# Patient Record
Sex: Female | Born: 1985 | Race: Black or African American | Hispanic: No | Marital: Single | Smoking: Former smoker
Health system: Southern US, Community
[De-identification: ages and names within clinical notes are randomized; demographics above are authoritative.]

## PROBLEM LIST (undated history)

## (undated) HISTORY — PX: ELBOW SURGERY: SHX618

---

## 2008-12-29 ENCOUNTER — Emergency Department (HOSPITAL_BASED_OUTPATIENT_CLINIC_OR_DEPARTMENT_OTHER): Admission: EM | Admit: 2008-12-29 | Discharge: 2008-12-29 | Payer: Self-pay | Admitting: Emergency Medicine

## 2008-12-29 ENCOUNTER — Ambulatory Visit: Payer: Self-pay | Admitting: Diagnostic Radiology

## 2010-05-05 ENCOUNTER — Emergency Department (INDEPENDENT_AMBULATORY_CARE_PROVIDER_SITE_OTHER): Payer: Self-pay

## 2010-05-05 ENCOUNTER — Emergency Department (HOSPITAL_BASED_OUTPATIENT_CLINIC_OR_DEPARTMENT_OTHER)
Admission: EM | Admit: 2010-05-05 | Discharge: 2010-05-05 | Disposition: A | Discharge status: Home / Self Care | Payer: Self-pay | Attending: Emergency Medicine | Admitting: Emergency Medicine

## 2010-05-05 DIAGNOSIS — S61409A Unspecified open wound of unspecified hand, initial encounter: Secondary | ICD-10-CM | POA: Insufficient documentation

## 2010-05-05 DIAGNOSIS — S61509A Unspecified open wound of unspecified wrist, initial encounter: Secondary | ICD-10-CM | POA: Insufficient documentation

## 2010-05-05 DIAGNOSIS — W268XXA Contact with other sharp object(s), not elsewhere classified, initial encounter: Secondary | ICD-10-CM | POA: Insufficient documentation

## 2010-05-05 DIAGNOSIS — Y92009 Unspecified place in unspecified non-institutional (private) residence as the place of occurrence of the external cause: Secondary | ICD-10-CM | POA: Insufficient documentation

## 2010-05-05 DIAGNOSIS — F17200 Nicotine dependence, unspecified, uncomplicated: Secondary | ICD-10-CM | POA: Insufficient documentation

## 2010-05-05 DIAGNOSIS — S61209A Unspecified open wound of unspecified finger without damage to nail, initial encounter: Secondary | ICD-10-CM | POA: Insufficient documentation

## 2012-11-01 ENCOUNTER — Encounter (HOSPITAL_BASED_OUTPATIENT_CLINIC_OR_DEPARTMENT_OTHER): Payer: Self-pay | Admitting: Emergency Medicine

## 2012-11-01 ENCOUNTER — Emergency Department (HOSPITAL_BASED_OUTPATIENT_CLINIC_OR_DEPARTMENT_OTHER)
Admission: EM | Admit: 2012-11-01 | Discharge: 2012-11-01 | Discharge status: Against Medical Advice | Payer: Medicaid Other | Attending: Emergency Medicine | Admitting: Emergency Medicine

## 2012-11-01 DIAGNOSIS — M25519 Pain in unspecified shoulder: Secondary | ICD-10-CM | POA: Insufficient documentation

## 2012-11-01 DIAGNOSIS — F17200 Nicotine dependence, unspecified, uncomplicated: Secondary | ICD-10-CM | POA: Insufficient documentation

## 2012-11-01 NOTE — ED Notes (Signed)
Pt reports right posterior shoulder pain radiating to right side of upper back

## 2012-11-01 NOTE — ED Notes (Signed)
Pt observed walking out of ER.  She states she has to take someone home and will return.  Informed pt she would need to check back in for evaluation.

## 2013-08-30 ENCOUNTER — Emergency Department (HOSPITAL_BASED_OUTPATIENT_CLINIC_OR_DEPARTMENT_OTHER)
Admission: EM | Admit: 2013-08-30 | Discharge: 2013-08-30 | Disposition: A | Discharge status: Home / Self Care | Payer: Medicaid Other | Attending: Emergency Medicine | Admitting: Emergency Medicine

## 2013-08-30 ENCOUNTER — Emergency Department (HOSPITAL_BASED_OUTPATIENT_CLINIC_OR_DEPARTMENT_OTHER): Payer: Medicaid Other

## 2013-08-30 ENCOUNTER — Encounter (HOSPITAL_BASED_OUTPATIENT_CLINIC_OR_DEPARTMENT_OTHER): Payer: Self-pay | Admitting: Emergency Medicine

## 2013-08-30 DIAGNOSIS — M25569 Pain in unspecified knee: Secondary | ICD-10-CM | POA: Insufficient documentation

## 2013-08-30 DIAGNOSIS — Z791 Long term (current) use of non-steroidal anti-inflammatories (NSAID): Secondary | ICD-10-CM | POA: Insufficient documentation

## 2013-08-30 DIAGNOSIS — M25561 Pain in right knee: Secondary | ICD-10-CM

## 2013-08-30 DIAGNOSIS — F17200 Nicotine dependence, unspecified, uncomplicated: Secondary | ICD-10-CM | POA: Insufficient documentation

## 2013-08-30 MED ORDER — NAPROXEN 500 MG PO TABS: 500.0000 mg | ORAL_TABLET | Freq: Two times a day (BID) | ORAL | Status: DC

## 2013-08-30 NOTE — ED Provider Notes (Signed)
CSN: [id number]     Arrival date & time 08/30/13  1631 History   First MD Initiated Contact with Patient 08/30/13 1638     Chief Complaint  Patient presents with  . Knee Pain     (Consider location/radiation/quality/duration/timing/severity/associated sxs/prior Treatment) HPI Comments: Patient is a 28 year old female who presents to the emergency department complaining of right knee pain times one week. No known injury or trauma. She does report she plays around with her friends and family including football which she has done prior to onset of knee pain. She believes her knee appears swollen. She has tried taking ibuprofen with minimal relief. Pain occasionally radiates of the front of her right thigh, worse with pressure. Denies fever, chills or skin color changes.  Patient is a 28 y.o. female presenting with knee pain. The history is provided by the patient.  Knee Pain Associated symptoms: no fever     History reviewed. No pertinent past medical history. Past Surgical History  Procedure Laterality Date  . Elbow surgery     No family history on file. History  Substance Use Topics  . Smoking status: Current Every Day Smoker -- 0.50 packs/day    Types: Cigarettes  . Smokeless tobacco: Not on file  . Alcohol Use: No     Comment: occasional   OB History   Grav Para Term Preterm Abortions TAB SAB Ect Mult Living                 Review of Systems  Constitutional: Negative for fever.  HENT: Negative.   Musculoskeletal:       + R knee pain and swelling.  Skin: Negative.   Neurological: Negative.       Allergies  Review of patient's allergies indicates no known allergies.  Home Medications   Prior to Admission medications   Medication Sig Start Date End Date Taking? Authorizing Provider  naproxen (NAPROSYN) 500 MG tablet Take 1 tablet (500 mg total) by mouth 2 (two) times daily. 08/30/13   Trevor Maceobyn M Albert, PA-C   BP 138/77  Pulse 82  Temp(Src) 98.3 F (36.8 C) (Oral)   Resp 18  Ht 5\' 5"  (1.651 m)  Wt 145 lb (65.772 kg)  BMI 24.13 kg/m2  SpO2 100%  LMP 08/28/2013 Physical Exam  Nursing note and vitals reviewed. Constitutional: She is oriented to person, place, and time. She appears well-developed and well-nourished. No distress.  HENT:  Head: Normocephalic and atraumatic.  Mouth/Throat: Oropharynx is clear and moist.  Eyes: Conjunctivae and EOM are normal.  Neck: Normal range of motion. Neck supple.  Cardiovascular: Normal rate, regular rhythm, normal heart sounds and intact distal pulses.   Pulmonary/Chest: Effort normal and breath sounds normal. No respiratory distress.  Musculoskeletal:  TTP medial joint line of right knee with swelling medially. No erythema or warmth. Pain with valgus testing. Able to flex and extend right knee, pain noted. No calf or thigh tenderness/swelling.  Neurological: She is alert and oriented to person, place, and time. No sensory deficit.  Skin: Skin is warm and dry.  Psychiatric: She has a normal mood and affect. Her behavior is normal.    ED Course  Procedures (including critical care time) Labs Review Labs Reviewed - No data to display  Imaging Review Dg Knee Complete 4 Views Right  08/30/2013   CLINICAL DATA:  Right knee pain after injury.  EXAM: RIGHT KNEE - COMPLETE 4+ VIEW  COMPARISON:  None.  FINDINGS: Imaged bones, joints and soft tissues appear  normal.  IMPRESSION: Normal exam.   Electronically Signed   By: Drusilla Kanner M.D.   On: 08/30/2013 17:50     EKG Interpretation None      MDM   Final diagnoses:  Right knee pain   Patient presenting with right knee pain. She is well appearing and in no apparent distress. Afebrile, vital signs stable. Neurovascularly intact. No known injury or trauma, however she may have hurt herself while playing football with her friends and family. Swelling and pain noted to the medial aspect of right knee. X-ray without any acute finding. Cannot exclude ligamentous  injury. Knee sleeve applied, crutches given. Discussed RICE, NSAIDs. F/u with ortho if no improvement. Stable for d/c. Return precautions given. Patient states understanding of treatment care plan and is agreeable.   Trevor Mace, PA-C 08/30/13 1610

## 2013-08-30 NOTE — Discharge Instructions (Signed)
Take naproxen as directed for pain and swelling. Knee Pain The knee is the complex joint between your thigh and your lower leg. It is made up of bones, tendons, ligaments, and cartilage. The bones that make up the knee are:  The femur in the thigh.  The tibia and fibula in the lower leg.  The patella or kneecap riding in the groove on the lower femur. CAUSES  Knee pain is a common complaint with many causes. A few of these causes are:  Injury, such as:  A ruptured ligament or tendon injury.  Torn cartilage.  Medical conditions, such as:  Gout  Arthritis  Infections  Overuse, over training, or overdoing a physical activity. Knee pain can be minor or severe. Knee pain can accompany debilitating injury. Minor knee problems often respond well to self-care measures or get well on their own. More serious injuries may need medical intervention or even surgery. SYMPTOMS The knee is complex. Symptoms of knee problems can vary widely. Some of the problems are:  Pain with movement and weight bearing.  Swelling and tenderness.  Buckling of the knee.  Inability to straighten or extend your knee.  Your knee locks and you cannot straighten it.  Warmth and redness with pain and fever.  Deformity or dislocation of the kneecap. DIAGNOSIS  Determining what is wrong may be very straight forward such as when there is an injury. It can also be challenging because of the complexity of the knee. Tests to make a diagnosis may include:  Your caregiver taking a history and doing a physical exam.  Routine X-rays can be used to rule out other problems. X-rays will not reveal a cartilage tear. Some injuries of the knee can be diagnosed by:  Arthroscopy a surgical technique by which a small video camera is inserted through tiny incisions on the sides of the knee. This procedure is used to examine and repair internal knee joint problems. Tiny instruments can be used during arthroscopy to repair  the torn knee cartilage (meniscus).  Arthrography is a radiology technique. A contrast liquid is directly injected into the knee joint. Internal structures of the knee joint then become visible on X-ray film.  An MRI scan is a non X-ray radiology procedure in which magnetic fields and a computer produce two- or three-dimensional images of the inside of the knee. Cartilage tears are often visible using an MRI scanner. MRI scans have largely replaced arthrography in diagnosing cartilage tears of the knee.  Blood work.  Examination of the fluid that helps to lubricate the knee joint (synovial fluid). This is done by taking a sample out using a needle and a syringe. TREATMENT The treatment of knee problems depends on the cause. Some of these treatments are:  Depending on the injury, proper casting, splinting, surgery, or physical therapy care will be needed.  Give yourself adequate recovery time. Do not overuse your joints. If you begin to get sore during workout routines, back off. Slow down or do fewer repetitions.  For repetitive activities such as cycling or running, maintain your strength and nutrition.  Alternate muscle groups. For example, if you are a weight lifter, work the upper body on one day and the lower body the next.  Either tight or weak muscles do not give the proper support for your knee. Tight or weak muscles do not absorb the stress placed on the knee joint. Keep the muscles surrounding the knee strong.  Take care of mechanical problems.  If you have  flat feet, orthotics or special shoes may help. See your caregiver if you need help.  Arch supports, sometimes with wedges on the inner or outer aspect of the heel, can help. These can shift pressure away from the side of the knee most bothered by osteoarthritis.  A brace called an "unloader" brace also may be used to help ease the pressure on the most arthritic side of the knee.  If your caregiver has prescribed crutches,  braces, wraps or ice, use as directed. The acronym for this is PRICE. This means protection, rest, ice, compression, and elevation.  Nonsteroidal anti-inflammatory drugs (NSAIDs), can help relieve pain. But if taken immediately after an injury, they may actually increase swelling. Take NSAIDs with food in your stomach. Stop them if you develop stomach problems. Do not take these if you have a history of ulcers, stomach pain, or bleeding from the bowel. Do not take without your caregiver's approval if you have problems with fluid retention, heart failure, or kidney problems.  For ongoing knee problems, physical therapy may be helpful.  Glucosamine and chondroitin are over-the-counter dietary supplements. Both may help relieve the pain of osteoarthritis in the knee. These medicines are different from the usual anti-inflammatory drugs. Glucosamine may decrease the rate of cartilage destruction.  Injections of a corticosteroid drug into your knee joint may help reduce the symptoms of an arthritis flare-up. They may provide pain relief that lasts a few months. You may have to wait a few months between injections. The injections do have a small increased risk of infection, water retention, and elevated blood sugar levels.  Hyaluronic acid injected into damaged joints may ease pain and provide lubrication. These injections may work by reducing inflammation. A series of shots may give relief for as long as 6 months.  Topical painkillers. Applying certain ointments to your skin may help relieve the pain and stiffness of osteoarthritis. Ask your pharmacist for suggestions. Many over the-counter products are approved for temporary relief of arthritis pain.  In some countries, doctors often prescribe topical NSAIDs for relief of chronic conditions such as arthritis and tendinitis. A review of treatment with NSAID creams found that they worked as well as oral medications but without the serious side  effects. PREVENTION  Maintain a healthy weight. Extra pounds put more strain on your joints.  Get strong, stay limber. Weak muscles are a common cause of knee injuries. Stretching is important. Include flexibility exercises in your workouts.  Be smart about exercise. If you have osteoarthritis, chronic knee pain or recurring injuries, you may need to change the way you exercise. This does not mean you have to stop being active. If your knees ache after jogging or playing basketball, consider switching to swimming, water aerobics, or other low-impact activities, at least for a few days a week. Sometimes limiting high-impact activities will provide relief.  Make sure your shoes fit well. Choose footwear that is right for your sport.  Protect your knees. Use the proper gear for knee-sensitive activities. Use kneepads when playing volleyball or laying carpet. Buckle your seat belt every time you drive. Most shattered kneecaps occur in car accidents.  Rest when you are tired. SEEK MEDICAL CARE IF:  You have knee pain that is continual and does not seem to be getting better.  SEEK IMMEDIATE MEDICAL CARE IF:  Your knee joint feels hot to the touch and you have a high fever. MAKE SURE YOU:   Understand these instructions.  Will watch your condition.  Will  get help right away if you are not doing well or get worse. Document Released: 10/30/2006 Document Revised: 03/27/2011 Document Reviewed: 10/30/2006 [facility] Patient Information 2015 [place] [place]. This information is not intended to replace advice given to you by your health care provider. Make sure you discuss any questions you have with your health care provider. RICE: Routine Care for Injuries The routine care of many injuries includes Rest, Ice, Compression, and Elevation (RICE). HOME CARE INSTRUCTIONS  Rest is needed to allow your body to heal. Routine activities can usually be resumed when comfortable. Injured tendons and bones can  take up to 6 weeks to heal. Tendons are the cord-like structures that attach muscle to bone.  Ice following an injury helps keep the swelling down and reduces pain.  Put ice in a plastic bag.  Place a towel between your skin and the bag.  Leave the ice on for 15-20 minutes, 3-4 times a day, or as directed by your health care provider. Do this while awake, for the first 24 to 48 hours. After that, continue as directed by your caregiver.  Compression helps keep swelling down. It also gives support and helps with discomfort. If an elastic bandage has been applied, it should be removed and reapplied every 3 to 4 hours. It should not be applied tightly, but firmly enough to keep swelling down. Watch fingers or toes for swelling, bluish discoloration, coldness, numbness, or excessive pain. If any of these problems occur, remove the bandage and reapply loosely. Contact your caregiver if these problems continue.  Elevation helps reduce swelling and decreases pain. With extremities, such as the arms, hands, legs, and feet, the injured area should be placed near or above the level of the heart, if possible. SEEK IMMEDIATE MEDICAL CARE IF:  You have persistent pain and swelling.  You develop redness, numbness, or unexpected weakness.  Your symptoms are getting worse rather than improving after several days. These symptoms may indicate that further evaluation or further X-rays are needed. Sometimes, X-rays may not show a small broken bone (fracture) until 1 week or 10 days later. Make a follow-up appointment with your caregiver. Ask when your X-ray results will be ready. Make sure you get your X-ray results. Document Released: 04/16/2000 Document Revised: 01/07/2013 Document Reviewed: 06/03/2010 [facility] Patient Information 2015 [place], [place]. This information is not intended to replace advice given to you by your health care provider. Make sure you discuss any questions you have with your health care  provider.

## 2013-08-30 NOTE — ED Notes (Signed)
Pt c/o right pain that began 1 week ago. Pt is unsure of injury.

## 2013-09-01 NOTE — ED Provider Notes (Signed)
History/physical exam/procedure(s) were performed by non-physician practitioner and as supervising physician I was immediately available for consultation/collaboration. I have reviewed all notes and am in agreement with care and plan.   Hilario Quarryanielle S Sabriya Yono, MD 09/01/13 [phone]

## 2013-09-05 ENCOUNTER — Emergency Department (HOSPITAL_COMMUNITY): Payer: No Typology Code available for payment source

## 2013-09-05 ENCOUNTER — Emergency Department (HOSPITAL_COMMUNITY)
Admission: EM | Admit: 2013-09-05 | Discharge: 2013-09-05 | Disposition: A | Discharge status: Home / Self Care | Payer: No Typology Code available for payment source | Attending: Emergency Medicine | Admitting: Emergency Medicine

## 2013-09-05 ENCOUNTER — Encounter (HOSPITAL_COMMUNITY): Payer: Self-pay | Admitting: Emergency Medicine

## 2013-09-05 DIAGNOSIS — Y9389 Activity, other specified: Secondary | ICD-10-CM | POA: Diagnosis not present

## 2013-09-05 DIAGNOSIS — S0990XA Unspecified injury of head, initial encounter: Secondary | ICD-10-CM | POA: Insufficient documentation

## 2013-09-05 DIAGNOSIS — S99919A Unspecified injury of unspecified ankle, initial encounter: Secondary | ICD-10-CM | POA: Diagnosis present

## 2013-09-05 DIAGNOSIS — S99929A Unspecified injury of unspecified foot, initial encounter: Principal | ICD-10-CM

## 2013-09-05 DIAGNOSIS — Z791 Long term (current) use of non-steroidal anti-inflammatories (NSAID): Secondary | ICD-10-CM | POA: Diagnosis not present

## 2013-09-05 DIAGNOSIS — S298XXA Other specified injuries of thorax, initial encounter: Secondary | ICD-10-CM | POA: Insufficient documentation

## 2013-09-05 DIAGNOSIS — F1210 Cannabis abuse, uncomplicated: Secondary | ICD-10-CM | POA: Diagnosis not present

## 2013-09-05 DIAGNOSIS — Y92410 Unspecified street and highway as the place of occurrence of the external cause: Secondary | ICD-10-CM | POA: Diagnosis not present

## 2013-09-05 DIAGNOSIS — F17200 Nicotine dependence, unspecified, uncomplicated: Secondary | ICD-10-CM | POA: Diagnosis not present

## 2013-09-05 DIAGNOSIS — S8990XA Unspecified injury of unspecified lower leg, initial encounter: Secondary | ICD-10-CM | POA: Diagnosis not present

## 2013-09-05 LAB — RAPID URINE DRUG SCREEN, HOSP PERFORMED
AMPHETAMINES: NOT DETECTED
BARBITURATES: NOT DETECTED
BENZODIAZEPINES: NOT DETECTED
Cocaine: NOT DETECTED
Opiates: NOT DETECTED
TETRAHYDROCANNABINOL: POSITIVE — AB

## 2013-09-05 MED ORDER — METHOCARBAMOL 500 MG PO TABS: 500.0000 mg | ORAL_TABLET | Freq: Two times a day (BID) | ORAL | Status: DC

## 2013-09-05 MED ORDER — NAPROXEN 500 MG PO TABS: 500.0000 mg | ORAL_TABLET | Freq: Two times a day (BID) | ORAL | Status: DC

## 2013-09-05 MED ORDER — HYDROCODONE-ACETAMINOPHEN 5-325 MG PO TABS
1.0000 | ORAL_TABLET | Freq: Once | ORAL | Status: AC
  Administered 2013-09-05: 1 via ORAL
  Filled 2013-09-05: qty 1

## 2013-09-05 NOTE — ED Notes (Addendum)
Pt is still attempting to use female urinal. If pt is unable, will talk to doctor about next step.
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
## 2013-09-05 NOTE — Discharge Instructions (Signed)
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
## 2013-09-05 NOTE — ED Notes (Signed)
Per triage pt transferred to CT and xray at present time and will be brought back to room 10 post procedure.

## 2013-09-05 NOTE — ED Notes (Signed)
Pt denies pain in ribcage or headache at present time.
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
## 2013-09-05 NOTE — ED Notes (Signed)
Pt cooperative, oriented,with slowed speech and delayed response to instructions. PA at bedside and completing neurological assessment. Pt urniated on self, and stated that she was not aware that it happened.ister at bedside, states that slowed speech is not normal for her sister. Pt stated that the only medicine she took today was benadryl for her sinus drainage.

## 2013-09-05 NOTE — ED Notes (Signed)
Pt able to ambulate independently prior to discharge.

## 2013-09-05 NOTE — ED Notes (Signed)
Pt reports decrease in ribcage pain but denies change in knee or headache.
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
## 2013-09-07 NOTE — ED Provider Notes (Signed)
Medical screening examination/treatment/procedure(s) were performed by non-physician practitioner and as supervising physician I was immediately available for consultation/collaboration.   EKG Interpretation None        Adama Ivins, MD 09/07/13 0739 

## 2014-05-09 ENCOUNTER — Emergency Department (HOSPITAL_BASED_OUTPATIENT_CLINIC_OR_DEPARTMENT_OTHER)
Admission: EM | Admit: 2014-05-09 | Discharge: 2014-05-09 | Disposition: A | Discharge status: Home / Self Care | Payer: Medicaid Other | Attending: Emergency Medicine | Admitting: Emergency Medicine

## 2014-05-09 ENCOUNTER — Emergency Department (HOSPITAL_BASED_OUTPATIENT_CLINIC_OR_DEPARTMENT_OTHER): Payer: Medicaid Other

## 2014-05-09 ENCOUNTER — Encounter (HOSPITAL_BASED_OUTPATIENT_CLINIC_OR_DEPARTMENT_OTHER): Payer: Self-pay

## 2014-05-09 DIAGNOSIS — S7002XA Contusion of left hip, initial encounter: Secondary | ICD-10-CM | POA: Insufficient documentation

## 2014-05-09 DIAGNOSIS — Y999 Unspecified external cause status: Secondary | ICD-10-CM | POA: Insufficient documentation

## 2014-05-09 DIAGNOSIS — Y939 Activity, unspecified: Secondary | ICD-10-CM | POA: Insufficient documentation

## 2014-05-09 DIAGNOSIS — Z79899 Other long term (current) drug therapy: Secondary | ICD-10-CM | POA: Insufficient documentation

## 2014-05-09 DIAGNOSIS — S7001XA Contusion of right hip, initial encounter: Secondary | ICD-10-CM | POA: Insufficient documentation

## 2014-05-09 DIAGNOSIS — Z720 Tobacco use: Secondary | ICD-10-CM | POA: Insufficient documentation

## 2014-05-09 DIAGNOSIS — S59901A Unspecified injury of right elbow, initial encounter: Secondary | ICD-10-CM | POA: Insufficient documentation

## 2014-05-09 DIAGNOSIS — Z8781 Personal history of (healed) traumatic fracture: Secondary | ICD-10-CM | POA: Insufficient documentation

## 2014-05-09 DIAGNOSIS — Z3202 Encounter for pregnancy test, result negative: Secondary | ICD-10-CM | POA: Insufficient documentation

## 2014-05-09 DIAGNOSIS — T07XXXA Unspecified multiple injuries, initial encounter: Secondary | ICD-10-CM

## 2014-05-09 DIAGNOSIS — Z791 Long term (current) use of non-steroidal anti-inflammatories (NSAID): Secondary | ICD-10-CM | POA: Insufficient documentation

## 2014-05-09 DIAGNOSIS — Y929 Unspecified place or not applicable: Secondary | ICD-10-CM | POA: Insufficient documentation

## 2014-05-09 LAB — PREGNANCY, URINE: PREG TEST UR: NEGATIVE

## 2014-05-09 MED ORDER — HYDROCODONE-ACETAMINOPHEN 5-325 MG PO TABS: 2.0000 | ORAL_TABLET | ORAL | Status: DC | PRN
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
## 2014-05-09 NOTE — ED Notes (Signed)
Pt resting quietly in room upon arrival for DC instructions, pt appeared to be sleeping

## 2014-05-09 NOTE — ED Notes (Signed)
Pt alert and oriented x 4, mae x 4, gait very steady, able to ambulate w/o assistance

## 2014-05-09 NOTE — ED Notes (Signed)
Ice pack applied to rt hip per pt request.

## 2014-05-09 NOTE — Discharge Instructions (Signed)
Contusion °A contusion is a deep bruise. Contusions are the result of an injury that caused bleeding under the skin. The contusion may turn blue, purple, or yellow. Minor injuries will give you a painless contusion, but more severe contusions may stay painful and swollen for a few weeks.  °CAUSES  °A contusion is usually caused by a blow, trauma, or direct force to an area of the body. °SYMPTOMS  °· Swelling and redness of the injured area. °· Bruising of the injured area. °· Tenderness and soreness of the injured area. °· Pain. °DIAGNOSIS  °The diagnosis can be made by taking a history and physical exam. An X-ray, CT scan, or MRI may be needed to determine if there were any associated injuries, such as fractures. °TREATMENT  °Specific treatment will depend on what area of the body was injured. In general, the best treatment for a contusion is resting, icing, elevating, and applying cold compresses to the injured area. Over-the-counter medicines may also be recommended for pain control. Ask your caregiver what the best treatment is for your contusion. °HOME CARE INSTRUCTIONS  °· Put ice on the injured area. °¨ Put ice in a plastic bag. °¨ Place a towel between your skin and the bag. °¨ Leave the ice on for 15-20 minutes, 3-4 times a day, or as directed by your health care provider. °· Only take over-the-counter or prescription medicines for pain, discomfort, or fever as directed by your caregiver. Your caregiver may recommend avoiding anti-inflammatory medicines (aspirin, ibuprofen, and naproxen) for 48 hours because these medicines may increase bruising. °· Rest the injured area. °· If possible, elevate the injured area to reduce swelling. °SEEK IMMEDIATE MEDICAL CARE IF:  °· You have increased bruising or swelling. °· You have pain that is getting worse. °· Your swelling or pain is not relieved with medicines. °MAKE SURE YOU:  °· Understand these instructions. °· Will watch your condition. °· Will get help right  away if you are not doing well or get worse. °Document Released: 10/12/2004 Document Revised: 01/07/2013 Document Reviewed: 11/07/2010 °ExitCare® Patient Information ©2015 [employer]. This information is not intended to replace advice given to you by your health care provider. Make sure you discuss any questions you have with your health care provider. ° °

## 2016-10-06 ENCOUNTER — Encounter (HOSPITAL_BASED_OUTPATIENT_CLINIC_OR_DEPARTMENT_OTHER): Payer: Self-pay | Admitting: Adult Health

## 2016-10-06 ENCOUNTER — Emergency Department (HOSPITAL_BASED_OUTPATIENT_CLINIC_OR_DEPARTMENT_OTHER)
Admission: EM | Admit: 2016-10-06 | Discharge: 2016-10-07 | Disposition: A | Discharge status: Home / Self Care | Payer: Self-pay | Attending: Emergency Medicine | Admitting: Emergency Medicine

## 2016-10-06 ENCOUNTER — Emergency Department (HOSPITAL_BASED_OUTPATIENT_CLINIC_OR_DEPARTMENT_OTHER): Payer: Self-pay

## 2016-10-06 DIAGNOSIS — W010XXA Fall on same level from slipping, tripping and stumbling without subsequent striking against object, initial encounter: Secondary | ICD-10-CM | POA: Insufficient documentation

## 2016-10-06 DIAGNOSIS — Y999 Unspecified external cause status: Secondary | ICD-10-CM | POA: Insufficient documentation

## 2016-10-06 DIAGNOSIS — M25571 Pain in right ankle and joints of right foot: Secondary | ICD-10-CM

## 2016-10-06 DIAGNOSIS — S93401A Sprain of unspecified ligament of right ankle, initial encounter: Secondary | ICD-10-CM | POA: Insufficient documentation

## 2016-10-06 DIAGNOSIS — Y92007 Garden or yard of unspecified non-institutional (private) residence as the place of occurrence of the external cause: Secondary | ICD-10-CM | POA: Insufficient documentation

## 2016-10-06 DIAGNOSIS — Y939 Activity, unspecified: Secondary | ICD-10-CM | POA: Insufficient documentation

## 2016-10-06 DIAGNOSIS — F17210 Nicotine dependence, cigarettes, uncomplicated: Secondary | ICD-10-CM | POA: Insufficient documentation

## 2016-10-06 DIAGNOSIS — Z79899 Other long term (current) drug therapy: Secondary | ICD-10-CM | POA: Insufficient documentation

## 2016-10-06 MED ORDER — IBUPROFEN 400 MG PO TABS
600.0000 mg | ORAL_TABLET | Freq: Once | ORAL | Status: AC
  Administered 2016-10-07: 600 mg via ORAL
  Filled 2016-10-06: qty 1

## 2016-10-06 MED ORDER — IBUPROFEN 600 MG PO TABS: 600.0000 mg | ORAL_TABLET | Freq: Four times a day (QID) | ORAL | 0 refills | Status: DC | PRN

## 2016-10-06 NOTE — ED Triage Notes (Signed)
PResents with right ankle pain after tripping in a yard and rolling ankle today. Right ankle swollen. She took something for pain but does not remember what she took.

## 2016-10-06 NOTE — Discharge Instructions (Signed)
The best thing for your ankle sprain will be staying off of your ankle, use crutches and elevate when possible, apply ice. You may take ibuprofen to help with pain. I expect it to swell more before it starts to improve. If you are not seeing improvement after a week you can follow up with Dr. Pearletha Forge sports medicine. Return to the emergency department if new or concerning symptoms develop.
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
## 2016-10-09 ENCOUNTER — Ambulatory Visit: Payer: Self-pay | Admitting: Family Medicine

## 2017-04-27 ENCOUNTER — Other Ambulatory Visit: Payer: Self-pay

## 2017-04-27 ENCOUNTER — Emergency Department (HOSPITAL_BASED_OUTPATIENT_CLINIC_OR_DEPARTMENT_OTHER)
Admission: EM | Admit: 2017-04-27 | Discharge: 2017-04-27 | Disposition: A | Discharge status: Home / Self Care | Payer: Self-pay | Attending: Emergency Medicine | Admitting: Emergency Medicine

## 2017-04-27 ENCOUNTER — Encounter (HOSPITAL_BASED_OUTPATIENT_CLINIC_OR_DEPARTMENT_OTHER): Payer: Self-pay

## 2017-04-27 ENCOUNTER — Emergency Department (HOSPITAL_BASED_OUTPATIENT_CLINIC_OR_DEPARTMENT_OTHER): Payer: Self-pay

## 2017-04-27 DIAGNOSIS — Z79899 Other long term (current) drug therapy: Secondary | ICD-10-CM | POA: Insufficient documentation

## 2017-04-27 DIAGNOSIS — R519 Headache, unspecified: Secondary | ICD-10-CM

## 2017-04-27 DIAGNOSIS — Z87891 Personal history of nicotine dependence: Secondary | ICD-10-CM | POA: Insufficient documentation

## 2017-04-27 DIAGNOSIS — R51 Headache: Secondary | ICD-10-CM | POA: Insufficient documentation

## 2017-04-27 MED ORDER — BUTALBITAL-APAP-CAFFEINE 50-325-40 MG PO TABS: 1.0000 | ORAL_TABLET | Freq: Four times a day (QID) | ORAL | 0 refills | Status: AC | PRN

## 2017-04-27 NOTE — ED Provider Notes (Signed)
[facility] EMERGENCY DEPARTMENT Provider Note   CSN: [id number] Arrival date & time: 04/27/17  1942     History   Chief Complaint Chief Complaint  Patient presents with  . Headache    HPI Amber Dickerson is a 32 y.o. female.  Patient is a 32 year old female with no significant being at past medical history presenting today with complaint of ongoing headaches for the last 1 year.  She states over the last year the headaches have become more frequent.  They seem to move around her head and never seem to be in the same location.  She is now having a headache almost every day.  They do not seem to be worse at any particular time of the day but she is sensitive to light and sound.  Sometimes she will have or as but not always.  Headaches are usually throbbing in nature.  She does not notice any allergies or congestion.  She denies any neck pain.  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  She occasionally will take a BC powder for the headache but states that that does not even usually work.  Normally she does not take anything and just deals with it.  She has not noted any new visual changes.  No unilateral symptoms of numbness or weakness.  The headaches will cause her to feel nauseated but she does not vomit.  The history is provided by the patient.  Headache   This is a chronic problem. Episode onset: 1 year. The problem occurs constantly. The problem has been gradually worsening. The headache is associated with bright light, activity and loud noise. Pain location: moves all over her head.  headaches are never in the same place. The quality of the pain is described as throbbing. The pain is at a severity of 3/10. The pain is moderate. Associated symptoms include nausea. Pertinent negatives include no anorexia, no fever, no palpitations, no syncope and no vomiting. Associated symptoms  comments: No neck pain. She has tried nothing for the symptoms. The treatment provided no relief.    History reviewed. No pertinent past medical history.  There are no active problems to display for this patient.   Past Surgical History:  Procedure Laterality Date  . ELBOW SURGERY       OB History    Gravida  1   Para  1   Term  1   Preterm      AB      Living  1     SAB      TAB      Ectopic      Multiple      Live Births               Home Medications    Prior to Admission medications   Medication Sig Start Date End Date Taking? Authorizing Provider  diphenhydrAMINE (BENADRYL) 25 mg capsule Take 25 mg by mouth every 6 (six) hours as needed for allergies.    [provider]  HYDROcodone-acetaminophen (NORCO/VICODIN) 5-325 MG per tablet Take 2 tablets by mouth every 4 (four) hours as needed. 05/09/14   Nelva Nay, MD  ibuprofen (ADVIL,MOTRIN) 600 MG tablet Take 1 tablet (600 mg total) by mouth every 6 (six) hours as needed. 10/06/16   Dartha Lodge, PA-C  methocarbamol (ROBAXIN) 500 MG tablet Take 1 tablet (500 mg  total) by mouth 2 (two) times daily. 09/05/13   Fayrene Helperran, [place], PA-C  naproxen (NAPROSYN) 500 MG tablet Take 1 tablet (500 mg total) by mouth 2 (two) times daily. 09/05/13   Fayrene Helperran, [place], PA-C    Family History No family history on file.  Social History Social History   Tobacco Use  . Smoking status: Former Smoker    Packs/day: 0.00  . Smokeless tobacco: Never Used  Substance Use Topics  . Alcohol use: Yes    Comment: occasional  . Drug use: No     Allergies   Patient has no known allergies.   Review of Systems Review of Systems  Constitutional: Negative for fever.  Cardiovascular: Negative for palpitations and syncope.  Gastrointestinal: Positive for nausea. Negative for anorexia and vomiting.  Neurological: Positive for headaches.  All other systems reviewed and are negative.    Physical Exam Updated Vital  Signs BP (!) 144/92 (BP Location: Left Arm)   Pulse 93   Temp 99.1 F (37.3 C) (Oral)   Resp 16   Ht 5\' 5"  (1.651 m)   Wt 71 kg (156 lb 8.4 oz)   LMP 04/12/2017   SpO2 100%   BMI 26.05 kg/m   Physical Exam  Constitutional: She is oriented to person, place, and time. She appears well-developed and well-nourished. No distress.  HENT:  Head: Normocephalic and atraumatic.  Right Ear: Tympanic membrane normal.  Left Ear: Tympanic membrane normal.  Mouth/Throat: Oropharynx is clear and moist.  Eyes: Pupils are equal, round, and reactive to light. Conjunctivae and EOM are normal.  Fundoscopic exam:      The right eye shows no papilledema.       The left eye shows no papilledema.  Neck: Normal range of motion. Neck supple.  Cardiovascular: Normal rate, regular rhythm and intact distal pulses. Exam reveals no friction rub.  Murmur heard. Pulmonary/Chest: Effort normal and breath sounds normal. No respiratory distress. She has no wheezes. She has no rales.  Abdominal: Soft. Bowel sounds are normal. She exhibits no distension. There is no tenderness. There is no rebound and no guarding.  Musculoskeletal: Normal range of motion. She exhibits no edema or tenderness.  No edema  Lymphadenopathy:    She has no cervical adenopathy.  Neurological: She is alert and oriented to person, place, and time. She has normal strength. No cranial nerve deficit or sensory deficit. Gait normal.  photophobia  Skin: Skin is warm and dry. No rash noted. No erythema.  Psychiatric: She has a normal mood and affect. Her behavior is normal.  Nursing note and vitals reviewed.    ED Treatments / Results  Labs (all labs ordered are listed, but only abnormal results are displayed) Labs Reviewed - No data to display  EKG None  Radiology Ct Head Wo Contrast  Result Date: 04/27/2017 CLINICAL DATA:  Chronic headache EXAM: CT HEAD WITHOUT CONTRAST TECHNIQUE: Contiguous axial images were obtained from the base  of the skull through the vertex without intravenous contrast. COMPARISON:  September 05, 2013 FINDINGS: Brain: The ventricles are normal in size and configuration. There is no intracranial mass, hemorrhage, extra-axial fluid collection, or midline shift. Gray-white compartments appear normal. No evident acute infarct. Vascular: No hyperdense vessels.  No vascular calcification evident. Skull: Bony calvarium appears intact. Sinuses/Orbits: There is opacification in anterior left ethmoid air cell. There is mild mucosal thickening in several other ethmoid air cells. Other visualized paranasal sinuses are clear. Orbits appear symmetric bilaterally. Other: Mastoid air cells are clear.  IMPRESSION: Areas of ethmoid sinus disease.  Study otherwise unremarkable. Electronically Signed   By: Bretta Bang III M.D.   On: 04/27/2017 20:43    Procedures Procedures (including critical care time)  Medications Ordered in ED Medications - No data to display   Initial Impression / Assessment and Plan / ED Course  I have reviewed the triage vital signs and the nursing notes.  Pertinent labs & imaging results that were available during my care of the patient were reviewed by me and considered in my medical decision making (see chart for details).    Patient is a 32 year old female with ongoing headaches for the last year.  They seem to be more chronic headaches in nature but not necessarily tension headaches.  It may be a migraine variant.  She has a normal neurologic exam.  CT was negative for mass or other acute findings.  She is noted to have a heart murmur on exam which she was unaware of but low suspicion that has anything to do with her headaches.  Did recommend wearing a mask so she is not exposed to paint fumes she does not drink alcohol or use tobacco.  Patient's blood pressure is mildly elevated today at 144/92 but not feel that that is the source of her headaches.  Recommended follow-up with a headache  clinic.  Given a prescription for Fiorecet to use when she gets severe headaches.   Final Clinical Impressions(s) / ED Diagnoses   Final diagnoses:  Headache disorder    ED Discharge Orders        Ordered    butalbital-acetaminophen-caffeine (FIORICET, ESGIC) 50-325-40 MG tablet  Every 6 hours PRN     04/27/17 2118       Gwyneth Sprout, MD 04/27/17 2120

## 2017-04-27 NOTE — Discharge Instructions (Signed)
Call the above numbers to get in to see a headache specialist

## 2017-04-27 NOTE — ED Triage Notes (Signed)
C/o HA x "over a year"-NAD-steady gait

## 2017-06-05 ENCOUNTER — Emergency Department (HOSPITAL_COMMUNITY): Payer: Self-pay

## 2017-06-05 ENCOUNTER — Emergency Department (HOSPITAL_COMMUNITY)
Admission: EM | Admit: 2017-06-05 | Discharge: 2017-06-05 | Disposition: A | Discharge status: Home / Self Care | Payer: Self-pay | Attending: Emergency Medicine | Admitting: Emergency Medicine

## 2017-06-05 ENCOUNTER — Encounter (HOSPITAL_COMMUNITY): Payer: Self-pay | Admitting: Emergency Medicine

## 2017-06-05 ENCOUNTER — Other Ambulatory Visit: Payer: Self-pay

## 2017-06-05 DIAGNOSIS — E876 Hypokalemia: Secondary | ICD-10-CM | POA: Insufficient documentation

## 2017-06-05 DIAGNOSIS — R112 Nausea with vomiting, unspecified: Secondary | ICD-10-CM

## 2017-06-05 DIAGNOSIS — R1084 Generalized abdominal pain: Secondary | ICD-10-CM

## 2017-06-05 LAB — URINALYSIS, ROUTINE W REFLEX MICROSCOPIC
Bilirubin Urine: NEGATIVE
Glucose, UA: NEGATIVE mg/dL
HGB URINE DIPSTICK: NEGATIVE
Ketones, ur: 20 mg/dL — AB
Leukocytes, UA: NEGATIVE
Nitrite: NEGATIVE
Protein, ur: NEGATIVE mg/dL
SPECIFIC GRAVITY, URINE: 1.016 (ref 1.005–1.030)
pH: 9 — ABNORMAL HIGH (ref 5.0–8.0)

## 2017-06-05 LAB — COMPREHENSIVE METABOLIC PANEL
ALBUMIN: 3.4 g/dL — AB (ref 3.5–5.0)
ALK PHOS: 63 U/L (ref 38–126)
ALT: 27 U/L (ref 14–54)
ANION GAP: 10 (ref 5–15)
AST: 26 U/L (ref 15–41)
BILIRUBIN TOTAL: 0.8 mg/dL (ref 0.3–1.2)
BUN: 9 mg/dL (ref 6–20)
CALCIUM: 9.6 mg/dL (ref 8.9–10.3)
CO2: 21 mmol/L — ABNORMAL LOW (ref 22–32)
CREATININE: 0.46 mg/dL (ref 0.44–1.00)
Chloride: 109 mmol/L (ref 101–111)
GFR calc Af Amer: >60 mL/min (ref >60)
GFR calc non Af Amer: >60 mL/min (ref >60)
GLUCOSE: 113 mg/dL — AB (ref 65–99)
Potassium: 3.2 mmol/L — ABNORMAL LOW (ref 3.5–5.1)
Sodium: 140 mmol/L (ref 135–145)
Total Protein: 6.2 g/dL — ABNORMAL LOW (ref 6.5–8.1)

## 2017-06-05 LAB — CBC
HCT: 35 % — ABNORMAL LOW (ref 36.0–46.0)
HEMOGLOBIN: 11.9 g/dL — AB (ref 12.0–15.0)
MCH: 28.4 pg (ref 26.0–34.0)
MCHC: 34 g/dL (ref 30.0–36.0)
MCV: 83.5 fL (ref 78.0–100.0)
PLATELETS: 215 10*3/uL (ref 150–400)
RBC: 4.19 MIL/uL (ref 3.87–5.11)
RDW: 11.3 % — ABNORMAL LOW (ref 11.5–15.5)
WBC: 5.3 10*3/uL (ref 4.0–10.5)

## 2017-06-05 LAB — I-STAT BETA HCG BLOOD, ED (MC, WL, AP ONLY)

## 2017-06-05 LAB — LIPASE, BLOOD: Lipase: 28 U/L (ref 11–51)

## 2017-06-05 MED ORDER — IOPAMIDOL (ISOVUE-300) INJECTION 61%
100.0000 mL | Freq: Once | INTRAVENOUS | Status: AC | PRN
  Administered 2017-06-05: 100 mL via INTRAVENOUS

## 2017-06-05 MED ORDER — LORAZEPAM 2 MG/ML IJ SOLN
1.0000 mg | Freq: Once | INTRAMUSCULAR | Status: AC
  Administered 2017-06-05: 1 mg via INTRAVENOUS
  Filled 2017-06-05: qty 1

## 2017-06-05 MED ORDER — CAPSAICIN 0.075 % EX CREA
TOPICAL_CREAM | Freq: Once | CUTANEOUS | Status: DC
  Filled 2017-06-05: qty 60

## 2017-06-05 MED ORDER — FAMOTIDINE IN NACL 20-0.9 MG/50ML-% IV SOLN
20.0000 mg | Freq: Once | INTRAVENOUS | Status: AC
  Administered 2017-06-05: 20 mg via INTRAVENOUS
  Filled 2017-06-05: qty 50

## 2017-06-05 MED ORDER — IOPAMIDOL (ISOVUE-300) INJECTION 61%
INTRAVENOUS | Status: AC
  Administered 2017-06-05: 15:00:00
  Filled 2017-06-05: qty 100

## 2017-06-05 MED ORDER — SODIUM CHLORIDE 0.9 % IV BOLUS
2000.0000 mL | Freq: Once | INTRAVENOUS | Status: AC
  Administered 2017-06-05: 2000 mL via INTRAVENOUS

## 2017-06-05 MED ORDER — FAMOTIDINE 20 MG PO TABS: 20.0000 mg | ORAL_TABLET | Freq: Two times a day (BID) | ORAL | 0 refills | Status: DC

## 2017-06-05 MED ORDER — METOCLOPRAMIDE HCL 5 MG/ML IJ SOLN
10.0000 mg | Freq: Once | INTRAMUSCULAR | Status: AC
  Administered 2017-06-05: 10 mg via INTRAVENOUS
  Filled 2017-06-05: qty 2

## 2017-06-05 MED ORDER — ONDANSETRON HCL 4 MG/2ML IJ SOLN
4.0000 mg | Freq: Once | INTRAMUSCULAR | Status: AC
  Administered 2017-06-05: 4 mg via INTRAVENOUS
  Filled 2017-06-05: qty 2

## 2017-06-05 MED ORDER — ONDANSETRON 4 MG PO TBDP: ORAL_TABLET | ORAL | 0 refills | Status: DC

## 2017-06-05 MED ORDER — POTASSIUM CHLORIDE CRYS ER 20 MEQ PO TBCR
40.0000 meq | EXTENDED_RELEASE_TABLET | Freq: Once | ORAL | Status: AC
Start: 2017-06-05 — End: 2017-06-05
  Administered 2017-06-05: 40 meq via ORAL
  Filled 2017-06-05: qty 2

## 2017-06-05 NOTE — Discharge Instructions (Addendum)
Evaluation today is reassuring, labs show mild dehydration and low potassium, this is extremely common with nausea vomiting and diarrhea.  Your CT scan shows no evidence of an acute problem in the belly.  You may use Zofran as needed for nausea.  Please take Pepcid twice daily at least 30 to 60 minutes before breakfast and dinner to help with gastric discomfort.  You may also use Tums as needed.  Please follow-up with your primary care doctor I have also provided a referral to GI if symptoms persist.  Return to the emergency department for worsening or localized abdominal pain persistent vomiting and you are unable to keep down fluids, fevers or any other new or concerning symptoms.

## 2017-06-05 NOTE — ED Triage Notes (Signed)
Patient here from home via EMS with complaints of lower abdominal pain, nausea, vomiting that started this morning when wakening. Denies pregnancy.

## 2017-06-05 NOTE — ED Notes (Signed)
Pt is alert and oriented x 4 and is verbally responsive. PT reports that she woke this morning with middle to lower abdominal pain. Pt reports N/v/d. 10/10 " hurting"

## 2017-06-05 NOTE — ED Provider Notes (Signed)
[facility]-EMERGENCY DEPT Provider Note   CSN: [id number] Arrival date & time: 06/05/17  1207     History   Chief Complaint Chief Complaint  Patient presents with  . Abdominal Pain  . Nausea  . Emesis    HPI Amber Dickerson is a 32 y.o. female.  Kyana Aicher is a 32 y.o. Female otherwise healthy, presents to the emergency department for acute onset of abdominal pain today.  Patient reports this morning she started having 10/10 abdominal pain in her mid to lower abdomen with associated nausea, vomiting and diarrhea.  Patient denies any blood in her emesis, no melena or hematochezia.  Patient reports this is happened multiple times in the past and she has been diagnosed with a GI virus.  Patient reports for the past month she has been having several episodes of nonbloody nonbilious emesis intermittently not associated with pain, but today symptoms got much worse, and much more persistent.  Patient reports a few loose stools, but no persistent diarrhea, she has not recently been on any antibiotics.  Patient reports occasional chills, but no fevers.  Patient denies any chest pain or shortness of breath.  She denies any dysuria, frequency, hematuria or flank pain.  Patient denies any vaginal discharge, vaginal bleeding or pelvic pain, reports she has not been sexually active over the past few months.  Patient endorses daily marijuana use, she last smoked yesterday.  No history of abdominal surgeries.     History reviewed. No pertinent past medical history.  There are no active problems to display for this patient.   Past Surgical History:  Procedure Laterality Date  . ELBOW SURGERY       OB History    Gravida  1   Para  1   Term  1   Preterm      AB      Living  1     SAB      TAB      Ectopic      Multiple      Live Births               Home Medications    Prior to Admission medications   Medication Sig Start Date End Date Taking?  Authorizing Provider  butalbital-acetaminophen-caffeine (FIORICET, ESGIC) 50-325-40 MG tablet Take 1-2 tablets by mouth every 6 (six) hours as needed for headache. Patient not taking: Reported on 06/05/2017 04/27/17 04/27/18  Gwyneth Sprout, MD  HYDROcodone-acetaminophen (NORCO/VICODIN) 5-325 MG per tablet Take 2 tablets by mouth every 4 (four) hours as needed. Patient not taking: Reported on 06/05/2017 05/09/14   Nelva Nay, MD  ibuprofen (ADVIL,MOTRIN) 600 MG tablet Take 1 tablet (600 mg total) by mouth every 6 (six) hours as needed. Patient not taking: Reported on 06/05/2017 10/06/16   Dartha Lodge, PA-C  methocarbamol (ROBAXIN) 500 MG tablet Take 1 tablet (500 mg total) by mouth 2 (two) times daily. Patient not taking: Reported on 06/05/2017 09/05/13   Fayrene Helper, PA-C  naproxen (NAPROSYN) 500 MG tablet Take 1 tablet (500 mg total) by mouth 2 (two) times daily. Patient not taking: Reported on 06/05/2017 09/05/13   Fayrene Helper, PA-C    Family History No family history on file.  Social History Social History   Tobacco Use  . Smoking status: Former Smoker    Packs/day: 0.00  . Smokeless tobacco: Never Used  Substance Use Topics  . Alcohol use: Yes    Comment: occasional  . Drug use: No  Allergies   Patient has no known allergies.   Review of Systems Review of Systems  Constitutional: Positive for chills. Negative for fever.  HENT: Negative for congestion, rhinorrhea and sore throat.   Eyes: Negative for visual disturbance.  Respiratory: Negative for cough and shortness of breath.   Cardiovascular: Negative for chest pain.  Gastrointestinal: Positive for abdominal pain, diarrhea, nausea and vomiting. Negative for blood in stool and constipation.  Genitourinary: Negative for dysuria, flank pain, frequency, hematuria, pelvic pain, vaginal bleeding and vaginal discharge.  Musculoskeletal: Negative for arthralgias and myalgias.  Skin: Negative for color change and rash.    Neurological: Negative for dizziness, syncope and light-headedness.     Physical Exam Updated Vital Signs BP (!) 164/72 (BP Location: Right Arm)   Pulse (!) 106   Temp 98.5 F (36.9 C) (Oral)   Resp 18   Ht  (1.651 m)   Wt 68 kg (150 lb)   SpO2 100%   BMI 24.96 kg/m   Physical Exam  Constitutional: She appears well-developed and well-nourished. No distress.  HENT:  Head: Normocephalic and atraumatic.  Mouth/Throat: Oropharynx is clear and moist.  Eyes: Right eye exhibits no discharge. Left eye exhibits no discharge.  Neck: Neck supple.  Cardiovascular: Normal rate, regular rhythm, normal heart sounds and intact distal pulses.  Pulmonary/Chest: Effort normal and breath sounds normal. No stridor. No respiratory distress. She has no wheezes. She has no rhonchi. She has no rales.  Respirations equal and unlabored, patient able to speak in full sentences, lungs clear to auscultation bilaterally  Abdominal: Soft. Normal appearance and bowel sounds are normal. She exhibits no distension, no pulsatile midline mass and no mass. There is generalized tenderness and tenderness in the epigastric area. There is no rigidity, no rebound and no guarding.  Abdomen soft, nondistended, bowel sounds present throughout, there is mild generalized tenderness reported by the patient with palpation, there is mild guarding in the epigastrium of the abdomen is otherwise benign, there is no rebound tenderness and no focal peritoneal signs, no CVA tenderness.  Musculoskeletal: She exhibits no edema or deformity.  Neurological: She is alert. Coordination normal.  Skin: Skin is warm and dry. Capillary refill takes less than 2 seconds. She is not diaphoretic.  Psychiatric: She has a normal mood and affect. Her behavior is normal.  Nursing note and vitals reviewed.    ED Treatments / Results  Labs (all labs ordered are listed, but only abnormal results are displayed) Labs Reviewed  COMPREHENSIVE  METABOLIC PANEL - Abnormal; Notable for the following components:      Result Value   Potassium 3.2 (*)    CO2 21 (*)    Glucose, Bld 113 (*)    Total Protein 6.2 (*)    Albumin 3.4 (*)    All other components within normal limits  CBC - Abnormal; Notable for the following components:   Hemoglobin 11.9 (*)    HCT 35.0 (*)    RDW 11.3 (*)    All other components within normal limits  URINALYSIS, ROUTINE W REFLEX MICROSCOPIC - Abnormal; Notable for the following components:   APPearance HAZY (*)    pH 9.0 (*)    Ketones, ur 20 (*)    All other components within normal limits  LIPASE, BLOOD  I-STAT BETA HCG BLOOD, ED ([facility], [facility], [facility] ONLY)    EKG None  Radiology No results found.  Procedures Procedures (including critical care time)  Medications Ordered in ED Medications  capsicum (ZOSTRIX)  0.075 % cream (has no administration in time range)  sodium chloride 0.9 % bolus 2,000 mL (0 mLs Intravenous Stopped 06/05/17 1625)  ondansetron (ZOFRAN) injection 4 mg (4 mg Intravenous Given 06/05/17 1416)  famotidine (PEPCID) IVPB 20 mg premix (0 mg Intravenous Stopped 06/05/17 1513)  LORazepam (ATIVAN) injection 1 mg (1 mg Intravenous Given 06/05/17 1416)  potassium chloride SA (K-DUR,KLOR-CON) CR tablet 40 mEq (40 mEq Oral Given 06/05/17 1415)  iopamidol (ISOVUE-300) 61 % injection (  Contrast Given 06/05/17 1445)  iopamidol (ISOVUE-300) 61 % injection 100 mL (100 mLs Intravenous Contrast Given 06/05/17 1442)  metoCLOPramide (REGLAN) injection 10 mg (10 mg Intravenous Given 06/05/17 1615)     Initial Impression / Assessment and Plan / ED Course  I have reviewed the triage vital signs and the nursing notes.  Pertinent labs & imaging results that were available during my care of the patient were reviewed by me and considered in my medical decision making (see chart for details).  Patient presents the emergency department for evaluation of generalized abdominal pain with associated nausea,  vomiting and some mild diarrhea.  Patient is mildly tachycardic on arrival, but otherwise vitals are stable.  Abdominal exam with generalized tenderness, most pronounced in the epigastrium, no guarding or peritoneal signs.  Patient does report that she has had intermittent vomiting for the past month.  Will get abdominal labs, give 2 L fluid bolus, Zofran and 0.5 mg of Ativan.  Given persistence of vomiting we will proceed with CT abdomen pelvis as patient has not had frequent abdominal imaging.  No associated urinary symptoms, patient reports she has not been sexually active in the past several months and denies any symptoms of pelvic pain, vaginal discharge, exam without focal lower abdominal pain.  Patient initially improved with Zofran, Pepcid and fluids.  Lab evaluation is reassuring, no leukocytosis, hemoglobin is slightly decreased but stable at 11.9.  Mild hypokalemia of 3.2, will replace orally with 40 any cues of potassium, no other acute electrolyte derangements, normal renal function and liver function, normal lipase.  Urinalysis with 20 of ketones suggesting mild dehydration, but no signs of infection, negative pregnancy.  CT abdomen pelvis shows no acute intra-abdominal abnormalities, small amount of likely physiologic fluid in the pelvis.  Discussed these results with the patient.  Patient symptoms seem to improve, was initially planning for discharge, but reevaluated the patient and she has began vomiting again, dose of Reglan ordered, will reassess after this, patient was previously tolerating p.o. fluids and was able to take her potassium without difficulty.  5:00 PM arrived at bedside to reassess patient, she is fully dressed and requesting to leave, but continues to complain of nausea, has not had any additional episodes of vomiting.  Offered patient additional antiemetics, will try capsaicin given patient's marijuana use, patient's presentation is suggestive of cyclic vomiting syndrome.   Patient and family are in agreement with this plan.  Capsaicin ordered.  5:17 PM notified by nursing that patient and family members eloped without notifying anyone, IV was removed.  Patient did not wait for discharge paperwork, or prescriptions for Zofran or Pepcid.  Final Clinical Impressions(s) / ED Diagnoses   Final diagnoses:  Non-intractable vomiting with nausea, unspecified vomiting type  Generalized abdominal pain  Hypokalemia    ED Discharge Orders        Ordered    famotidine (PEPCID) 20 MG tablet  2 times daily     06/05/17 1622    ondansetron (ZOFRAN ODT) 4 MG disintegrating  tablet     06/05/17 1622       Dartha Lodge, PA-C 06/05/17 1800    Rolland Porter, MD 06/09/17 1217

## 2017-06-05 NOTE — ED Notes (Signed)
Pt left with family and friends and did not make staff aware. Provider made aware.

## 2017-07-06 ENCOUNTER — Emergency Department (HOSPITAL_COMMUNITY)
Admission: EM | Admit: 2017-07-06 | Discharge: 2017-07-06 | Disposition: A | Discharge status: Home / Self Care | Payer: Self-pay | Attending: Emergency Medicine | Admitting: Emergency Medicine

## 2017-07-06 DIAGNOSIS — R109 Unspecified abdominal pain: Secondary | ICD-10-CM

## 2017-07-06 DIAGNOSIS — R112 Nausea with vomiting, unspecified: Secondary | ICD-10-CM | POA: Insufficient documentation

## 2017-07-06 DIAGNOSIS — Z87891 Personal history of nicotine dependence: Secondary | ICD-10-CM | POA: Insufficient documentation

## 2017-07-06 DIAGNOSIS — F12988 Cannabis use, unspecified with other cannabis-induced disorder: Secondary | ICD-10-CM | POA: Insufficient documentation

## 2017-07-06 DIAGNOSIS — R1030 Lower abdominal pain, unspecified: Secondary | ICD-10-CM | POA: Insufficient documentation

## 2017-07-06 LAB — CBC WITH DIFFERENTIAL/PLATELET
Basophils Absolute: 0 10*3/uL (ref 0.0–0.1)
Basophils Relative: 0 %
Eosinophils Absolute: 0.1 10*3/uL (ref 0.0–0.7)
Eosinophils Relative: 2 %
HCT: 37.6 % (ref 36.0–46.0)
Hemoglobin: 12.7 g/dL (ref 12.0–15.0)
Lymphocytes Relative: 42 %
Lymphs Abs: 2.4 10*3/uL (ref 0.7–4.0)
MCH: 28.3 pg (ref 26.0–34.0)
MCHC: 33.8 g/dL (ref 30.0–36.0)
MCV: 83.7 fL (ref 78.0–100.0)
Monocytes Absolute: 0.7 10*3/uL (ref 0.1–1.0)
Monocytes Relative: 12 %
Neutro Abs: 2.6 10*3/uL (ref 1.7–7.7)
Neutrophils Relative %: 44 %
Platelets: 270 10*3/uL (ref 150–400)
RBC: 4.49 MIL/uL (ref 3.87–5.11)
RDW: 11.7 % (ref 11.5–15.5)
WBC: 5.7 10*3/uL (ref 4.0–10.5)

## 2017-07-06 LAB — URINALYSIS, ROUTINE W REFLEX MICROSCOPIC
Bilirubin Urine: NEGATIVE
Glucose, UA: NEGATIVE mg/dL
Hgb urine dipstick: NEGATIVE
Ketones, ur: NEGATIVE mg/dL
Leukocytes, UA: NEGATIVE
Nitrite: NEGATIVE
Protein, ur: NEGATIVE mg/dL
Specific Gravity, Urine: 1.015 (ref 1.005–1.030)
pH: 8 (ref 5.0–8.0)

## 2017-07-06 LAB — COMPREHENSIVE METABOLIC PANEL
ALT: 27 U/L (ref 14–54)
AST: 28 U/L (ref 15–41)
Albumin: 3.6 g/dL (ref 3.5–5.0)
Alkaline Phosphatase: 82 U/L (ref 38–126)
Anion gap: 11 (ref 5–15)
BUN: 12 mg/dL (ref 6–20)
CO2: 24 mmol/L (ref 22–32)
Calcium: 9.8 mg/dL (ref 8.9–10.3)
Chloride: 108 mmol/L (ref 101–111)
Creatinine, Ser: 0.46 mg/dL (ref 0.44–1.00)
GFR calc Af Amer: >60 mL/min (ref >60)
GFR calc non Af Amer: >60 mL/min (ref >60)
Glucose, Bld: 114 mg/dL — ABNORMAL HIGH (ref 65–99)
Potassium: 3 mmol/L — ABNORMAL LOW (ref 3.5–5.1)
Sodium: 143 mmol/L (ref 135–145)
Total Bilirubin: 0.6 mg/dL (ref 0.3–1.2)
Total Protein: 6.9 g/dL (ref 6.5–8.1)

## 2017-07-06 LAB — RAPID URINE DRUG SCREEN, HOSP PERFORMED
Amphetamines: NOT DETECTED
Benzodiazepines: NOT DETECTED
Cocaine: NOT DETECTED
Opiates: NOT DETECTED
Tetrahydrocannabinol: POSITIVE — AB

## 2017-07-06 LAB — PREGNANCY, URINE: Preg Test, Ur: NEGATIVE

## 2017-07-06 LAB — LIPASE, BLOOD: Lipase: 30 U/L (ref 11–51)

## 2017-07-06 MED ORDER — ONDANSETRON HCL 4 MG PO TABS: 4.0000 mg | ORAL_TABLET | Freq: Four times a day (QID) | ORAL | 0 refills | Status: DC

## 2017-07-06 MED ORDER — PROMETHAZINE HCL 25 MG/ML IJ SOLN
12.5000 mg | Freq: Once | INTRAMUSCULAR | Status: AC
  Administered 2017-07-06: 12.5 mg via INTRAVENOUS
  Filled 2017-07-06: qty 1

## 2017-07-06 MED ORDER — HYDROMORPHONE HCL 1 MG/ML IJ SOLN: 0.5000 mg | Freq: Once | INTRAMUSCULAR | Status: DC

## 2017-07-06 MED ORDER — LORAZEPAM 2 MG/ML IJ SOLN
0.5000 mg | Freq: Once | INTRAMUSCULAR | Status: AC
  Administered 2017-07-06: 0.5 mg via INTRAVENOUS
  Filled 2017-07-06: qty 1

## 2017-07-06 MED ORDER — SODIUM CHLORIDE 0.9 % IV BOLUS
1000.0000 mL | Freq: Once | INTRAVENOUS | Status: AC
  Administered 2017-07-06: 1000 mL via INTRAVENOUS

## 2017-07-06 MED ORDER — HYDROMORPHONE HCL 1 MG/ML IJ SOLN
1.0000 mg | Freq: Once | INTRAMUSCULAR | Status: DC
  Administered 2017-07-06: 1 mg via INTRAVENOUS
  Filled 2017-07-06: qty 1

## 2017-07-06 MED ORDER — CAPSAICIN 0.025 % EX CREA
TOPICAL_CREAM | Freq: Once | CUTANEOUS | Status: AC
  Administered 2017-07-06: 10:00:00 via TOPICAL
  Filled 2017-07-06: qty 60

## 2017-07-06 NOTE — ED Triage Notes (Signed)
Sudden onset of epigastric abdominal pain that started this morning. +N/V, denies any urinary symptoms.

## 2017-07-06 NOTE — ED Provider Notes (Signed)
[facility]-EMERGENCY DEPT Provider Note   CSN: [id number] Arrival date & time: 07/06/17  4098     History   Chief Complaint Chief Complaint  Patient presents with  . Abdominal Pain    HPI Amber Dickerson is a 32 y.o. female.  HPI   32 year old female with abdominal pain and nausea/vomiting.  Onset very early this morning and persistent since then.  She reports pain across lower abdomen.  She appears uncomfortable and is currently having a hard time describing it.  She does state that it does not really lateralize though.  No fevers or chills.  No urinary complaints.  She is sexually active but does not think that she is pregnant.  No unusual vaginal bleeding or discharge.  No diarrhea.  Of note, patient was seen in the emergency room 1 month ago for similar sounding complaints.  She had a fairly unremarkable work-up at that time including CT of the abdomen and pelvis. She does smoke marijuana regularly.   No past medical history on file.  There are no active problems to display for this patient.   Past Surgical History:  Procedure Laterality Date  . ELBOW SURGERY       OB History    Gravida  1   Para  1   Term  1   Preterm      AB      Living  1     SAB      TAB      Ectopic      Multiple      Live Births               Home Medications    Prior to Admission medications   Medication Sig Start Date End Date Taking? Authorizing Provider  butalbital-acetaminophen-caffeine (FIORICET, ESGIC) 50-325-40 MG tablet Take 1-2 tablets by mouth every 6 (six) hours as needed for headache. Patient not taking: Reported on 06/05/2017 04/27/17 04/27/18  Gwyneth Sprout, MD  famotidine (PEPCID) 20 MG tablet Take 1 tablet (20 mg total) by mouth 2 (two) times daily. 06/05/17   Dartha Lodge, PA-C  HYDROcodone-acetaminophen (NORCO/VICODIN) 5-325 MG per tablet Take 2 tablets by mouth every 4 (four) hours as needed. Patient not taking: Reported on  06/05/2017 05/09/14   Nelva Nay, MD  ibuprofen (ADVIL,MOTRIN) 600 MG tablet Take 1 tablet (600 mg total) by mouth every 6 (six) hours as needed. Patient not taking: Reported on 06/05/2017 10/06/16   Dartha Lodge, PA-C  methocarbamol (ROBAXIN) 500 MG tablet Take 1 tablet (500 mg total) by mouth 2 (two) times daily. Patient not taking: Reported on 06/05/2017 09/05/13   Fayrene Helper, PA-C  naproxen (NAPROSYN) 500 MG tablet Take 1 tablet (500 mg total) by mouth 2 (two) times daily. Patient not taking: Reported on 06/05/2017 09/05/13   Fayrene Helper, PA-C  ondansetron [facility] ODT) 4 MG disintegrating tablet 4mg  ODT q4 hours prn nausea/vomit 06/05/17   Dartha Lodge, PA-C    Family History No family history on file.  Social History Social History   Tobacco Use  . Smoking status: Former Smoker    Packs/day: 0.00  . Smokeless tobacco: Never Used  Substance Use Topics  . Alcohol use: Yes    Comment: occasional  . Drug use: No     Allergies   Patient has no known allergies.   Review of Systems Review of Systems All systems reviewed and negative, other than as noted in HPI.   Physical Exam  Updated Vital Signs BP 130/85 (BP Location: Right Arm)   Pulse (!) 109   Temp 98.1 F (36.7 C) (Oral)   Resp 18   SpO2 100%   Physical Exam  Constitutional: She appears well-developed and well-nourished. No distress.  Laying in bed.  Changing position frequently.  Appears uncomfortable, but not toxic.  HENT:  Head: Normocephalic and atraumatic.  Eyes: Conjunctivae are normal. Right eye exhibits no discharge. Left eye exhibits no discharge.  Neck: Neck supple.  Cardiovascular: Normal rate, regular rhythm and normal heart sounds. Exam reveals no gallop and no friction rub.  No murmur heard. Pulmonary/Chest: Effort normal and breath sounds normal. No respiratory distress.  Abdominal: Soft. She exhibits no distension. There is no tenderness.  Mild diffuse tenderness without rebound or guarding.   No distention.  Musculoskeletal: She exhibits no edema or tenderness.  Neurological: She is alert.  Skin: Skin is warm and dry.  Psychiatric: She has a normal mood and affect. Her behavior is normal. Thought content normal.  Nursing note and vitals reviewed.    ED Treatments / Results  Labs (all labs ordered are listed, but only abnormal results are displayed) Labs Reviewed  URINALYSIS, ROUTINE W REFLEX MICROSCOPIC - Abnormal; Notable for the following components:      Result Value   APPearance CLOUDY (*)    All other components within normal limits  COMPREHENSIVE METABOLIC PANEL - Abnormal; Notable for the following components:   Potassium 3.0 (*)    Glucose, Bld 114 (*)    All other components within normal limits  RAPID URINE DRUG SCREEN, HOSP PERFORMED - Abnormal; Notable for the following components:   Tetrahydrocannabinol POSITIVE (*)    Barbiturates   (*)    Value: Result not available. Reagent lot number recalled by manufacturer.   All other components within normal limits  PREGNANCY, URINE  CBC WITH DIFFERENTIAL/PLATELET  LIPASE, BLOOD    EKG None  Radiology No results found.  Procedures Procedures (including critical care time)  Medications Ordered in ED Medications - No data to display   Initial Impression / Assessment and Plan / ED Course  I have reviewed the triage vital signs and the nursing notes.  Pertinent labs & imaging results that were available during my care of the patient were reviewed by me and considered in my medical decision making (see chart for details).     31yF with abdominal pain and n/v. Now feeling much better. I suspect cannabis hyperemesis syndrome. She does report smoking large quantities of marijuana on a daily basis. Discussed with pt. PRN zofran. Potassium rich foods for the next several days.   Final Clinical Impressions(s) / ED Diagnoses   Final diagnoses:  Abdominal pain, unspecified abdominal location  Cannabinoid  hyperemesis syndrome [facility](HCC)    ED Discharge Orders    None       Raeford RazorKohut, Maeleigh Buschman, MD 07/06/17 1141

## 2017-07-06 NOTE — ED Notes (Signed)
ED Provider at bedside. 

## 2017-07-06 NOTE — Discharge Instructions (Signed)
Regular marijuana use is likely causing your symptoms.

## 2018-01-28 ENCOUNTER — Encounter (HOSPITAL_BASED_OUTPATIENT_CLINIC_OR_DEPARTMENT_OTHER): Payer: Self-pay | Admitting: Emergency Medicine

## 2018-01-28 ENCOUNTER — Other Ambulatory Visit: Payer: Self-pay

## 2018-01-28 ENCOUNTER — Emergency Department (HOSPITAL_BASED_OUTPATIENT_CLINIC_OR_DEPARTMENT_OTHER)
Admission: EM | Admit: 2018-01-28 | Discharge: 2018-01-28 | Disposition: A | Discharge status: Home / Self Care | Payer: Self-pay | Attending: Emergency Medicine | Admitting: Emergency Medicine

## 2018-01-28 DIAGNOSIS — Z87891 Personal history of nicotine dependence: Secondary | ICD-10-CM | POA: Insufficient documentation

## 2018-01-28 DIAGNOSIS — R21 Rash and other nonspecific skin eruption: Secondary | ICD-10-CM | POA: Insufficient documentation

## 2018-01-28 MED ORDER — TERBINAFINE HCL 1 % EX CREA: 1.0000 "application " | TOPICAL_CREAM | Freq: Two times a day (BID) | CUTANEOUS | 0 refills | Status: DC

## 2018-01-28 MED ORDER — TERBINAFINE HCL 1 % EX CREA: 1.0000 "application " | TOPICAL_CREAM | Freq: Two times a day (BID) | CUTANEOUS | 0 refills | Status: AC

## 2018-01-28 MED ORDER — HYDROXYZINE HCL 25 MG PO TABS: 25.0000 mg | ORAL_TABLET | Freq: Four times a day (QID) | ORAL | 0 refills | Status: DC

## 2018-01-28 MED ORDER — HYDROXYZINE HCL 25 MG PO TABS
25.0000 mg | ORAL_TABLET | Freq: Once | ORAL | Status: AC
  Administered 2018-01-28: 25 mg via ORAL
  Filled 2018-01-28: qty 1

## 2018-01-28 NOTE — ED Triage Notes (Signed)
Reports generalized rash since end of December.  Describes rash as pruritic.  No draining wounds at present.

## 2018-01-28 NOTE — ED Provider Notes (Signed)
[facility] EMERGENCY DEPARTMENT Provider Note   CSN: [id number] Arrival date & time: 01/28/18  0859     History   Chief Complaint Chief Complaint  Patient presents with  . Rash    HPI Amber Dickerson is a 33 y.o. female who presents with a 1 month history of rash to her entire body.  It is itchy.  She reports it seems to be worse at night and after she showers.  She reports having the same rash in 2008 and she had a biopsy at [facility] which, per chart review, returned with dermatophytosis.  Patient thought she had been given steroids that got better.  Per chart review, patient had been given clobetasol prior to the biopsy which failed.  There is no information on the medication given after the biopsy.  Patient denies any fevers, new soaps, detergents, exposures.  She reports she did just get a dog, however the rash was present prior to that.  HPI  History reviewed. No pertinent past medical history.  There are no active problems to display for this patient.   Past Surgical History:  Procedure Laterality Date  . ELBOW SURGERY       OB History    Gravida  1   Para  1   Term  1   Preterm      AB      Living  1     SAB      TAB      Ectopic      Multiple      Live Births               Home Medications    Prior to Admission medications   Medication Sig Start Date End Date Taking? Authorizing Provider  butalbital-acetaminophen-caffeine (FIORICET, ESGIC) 50-325-40 MG tablet Take 1-2 tablets by mouth every 6 (six) hours as needed for headache. Patient not taking: Reported on 07/06/2017 04/27/17 04/27/18  Gwyneth Sprout, MD  famotidine (PEPCID) 20 MG tablet Take 1 tablet (20 mg total) by mouth 2 (two) times daily. Patient not taking: Reported on 07/06/2017 06/05/17   Dartha Lodge, PA-C  hydrOXYzine (ATARAX/VISTARIL) 25 MG tablet Take 1 tablet (25 mg total) by mouth every 6 (six) hours. 01/28/18   Emi Holes, PA-C  ondansetron (ZOFRAN ODT) 4 MG  disintegrating tablet 4mg  ODT q4 hours prn nausea/vomit Patient not taking: Reported on 07/06/2017 06/05/17   Dartha Lodge, PA-C  ondansetron (ZOFRAN) 4 MG tablet Take 1 tablet (4 mg total) by mouth every 6 (six) hours. 07/06/17   Raeford Razor, MD  terbinafine (LAMISIL) 1 % cream Apply 1 application topically 2 (two) times daily for 21 days. 01/28/18 02/18/18  Emi Holes, PA-C    Family History History reviewed. No pertinent family history.  Social History Social History   Tobacco Use  . Smoking status: Former Smoker    Packs/day: 0.00  . Smokeless tobacco: Never Used  Substance Use Topics  . Alcohol use: Yes    Comment: occasional  . Drug use: No     Allergies   Patient has no known allergies.   Review of Systems Review of Systems  Constitutional: Negative for fever.  Skin: Positive for rash.     Physical Exam Updated Vital Signs BP 129/84   Pulse (!) 104   Temp 98.4 F (36.9 C) (Oral)   Resp 18   Ht 5\' 5"  (1.651 m)   Wt 63.5 kg   LMP 01/01/2018 (Approximate)  SpO2 100%   BMI 23.30 kg/m   Physical Exam Vitals signs and nursing note reviewed.  Constitutional:      General: She is not in acute distress.    Appearance: She is well-developed. She is not diaphoretic.  HENT:     Head: Normocephalic and atraumatic.     Mouth/Throat:     Pharynx: No oropharyngeal exudate.  Eyes:     General: No scleral icterus.       Right eye: No discharge.        Left eye: No discharge.     Conjunctiva/sclera: Conjunctivae normal.     Pupils: Pupils are equal, round, and reactive to light.  Neck:     Musculoskeletal: Normal range of motion and neck supple.     Thyroid: No thyromegaly.  Cardiovascular:     Rate and Rhythm: Normal rate and regular rhythm.     Heart sounds: Normal heart sounds. No murmur. No friction rub. No gallop.   Pulmonary:     Effort: Pulmonary effort is normal. No respiratory distress.     Breath sounds: Normal breath sounds. No stridor. No  wheezing or rales.  Abdominal:     General: Bowel sounds are normal. There is no distension.     Palpations: Abdomen is soft.     Tenderness: There is no abdominal tenderness. There is no guarding or rebound.  Lymphadenopathy:     Cervical: No cervical adenopathy.  Skin:    General: Skin is warm and dry.     Coloration: Skin is not pale.     Findings: No rash.     Comments: Scaly rash, extensive on the torso and arms, some extends to the legs, spares palms and soles, see photo  Neurological:     Mental Status: She is alert.     Coordination: Coordination normal.        ED Treatments / Results  Labs (all labs ordered are listed, but only abnormal results are displayed) Labs Reviewed - No data to display  EKG None  Radiology No results found.  Procedures Procedures (including critical care time)  Medications Ordered in ED Medications  hydrOXYzine (ATARAX/VISTARIL) tablet 25 mg (25 mg Oral Given 01/28/18 1049)     Initial Impression / Assessment and Plan / ED Course  I have reviewed the triage vital signs and the nursing notes.  Pertinent labs & imaging results that were available during my care of the patient were reviewed by me and considered in my medical decision making (see chart for details).     Patient with probable return of dermatophyte infection.  Will cover with topical terbinafine and Vistaril.  There are no signs of secondary infection.  No signs of emergent type rash.  Patient will be referred back to dermatology.  Return precautions discussed.  Patient understands and agrees with plan.  Patient vital stable throughout ED course and discharged in satisfactory condition.  Final Clinical Impressions(s) / ED Diagnoses   Final diagnoses:  Rash    ED Discharge Orders         Ordered    terbinafine (LAMISIL) 1 % cream  2 times daily     01/28/18 1053    hydrOXYzine (ATARAX/VISTARIL) 25 MG tablet  Every 6 hours     01/28/18 [address]1053           Jacorion Klem,  [place] Perry M, [place] JerseyPA-C 01/28/18 1053    Charlynne PanderYao, David Hsienta, MD 01/28/18 [phone]

## 2018-01-28 NOTE — Discharge Instructions (Addendum)
Apply terbinafine 1-2 times daily for 3 weeks or until completely resolved.  Take Vistaril every 6 hours as needed for itching.  Please follow-up with dermatology.  I recommend calling now to make an appointment as it may take a while to get in.  Please return to emergency department if he develop new or worsening symptoms.

## 2018-07-30 ENCOUNTER — Encounter (HOSPITAL_COMMUNITY): Payer: Self-pay | Admitting: Emergency Medicine

## 2018-07-30 ENCOUNTER — Other Ambulatory Visit: Payer: Self-pay | Admitting: *Deleted

## 2018-07-30 ENCOUNTER — Other Ambulatory Visit: Payer: Self-pay

## 2018-07-30 ENCOUNTER — Emergency Department (HOSPITAL_COMMUNITY)
Admission: EM | Admit: 2018-07-30 | Discharge: 2018-07-30 | Disposition: A | Discharge status: Home / Self Care | Payer: HRSA Program | Attending: Emergency Medicine | Admitting: Emergency Medicine

## 2018-07-30 DIAGNOSIS — Z20822 Contact with and (suspected) exposure to covid-19: Secondary | ICD-10-CM

## 2018-07-30 DIAGNOSIS — B349 Viral infection, unspecified: Secondary | ICD-10-CM

## 2018-07-30 DIAGNOSIS — R42 Dizziness and giddiness: Secondary | ICD-10-CM | POA: Diagnosis present

## 2018-07-30 DIAGNOSIS — U071 COVID-19: Secondary | ICD-10-CM | POA: Insufficient documentation

## 2018-07-30 DIAGNOSIS — Z87891 Personal history of nicotine dependence: Secondary | ICD-10-CM | POA: Diagnosis not present

## 2018-07-30 LAB — COMPREHENSIVE METABOLIC PANEL
ALT: 29 U/L (ref 0–44)
AST: 24 U/L (ref 15–41)
Albumin: 3.4 g/dL — ABNORMAL LOW (ref 3.5–5.0)
Alkaline Phosphatase: 208 U/L — ABNORMAL HIGH (ref 38–126)
Anion gap: 9 (ref 5–15)
BUN: 9 mg/dL (ref 6–20)
CO2: 22 mmol/L (ref 22–32)
Calcium: 9 mg/dL (ref 8.9–10.3)
Chloride: 108 mmol/L (ref 98–111)
Creatinine, Ser: 0.36 mg/dL — ABNORMAL LOW (ref 0.44–1.00)
GFR calc Af Amer: >60 mL/min (ref >60)
GFR calc non Af Amer: >60 mL/min (ref >60)
Glucose, Bld: 137 mg/dL — ABNORMAL HIGH (ref 70–99)
Potassium: 4.5 mmol/L (ref 3.5–5.1)
Sodium: 139 mmol/L (ref 135–145)
Total Bilirubin: 0.3 mg/dL (ref 0.3–1.2)
Total Protein: 6.6 g/dL (ref 6.5–8.1)

## 2018-07-30 LAB — URINALYSIS, ROUTINE W REFLEX MICROSCOPIC
Bilirubin Urine: NEGATIVE
Glucose, UA: NEGATIVE mg/dL
Hgb urine dipstick: NEGATIVE
Ketones, ur: NEGATIVE mg/dL
Leukocytes,Ua: NEGATIVE
Nitrite: NEGATIVE
Protein, ur: NEGATIVE mg/dL
Specific Gravity, Urine: 1.016 (ref 1.005–1.030)
pH: 7 (ref 5.0–8.0)

## 2018-07-30 LAB — CBC
HCT: 38.8 % (ref 36.0–46.0)
Hemoglobin: 12.4 g/dL (ref 12.0–15.0)
MCH: 26.6 pg (ref 26.0–34.0)
MCHC: 32 g/dL (ref 30.0–36.0)
MCV: 83.3 fL (ref 80.0–100.0)
Platelets: 207 10*3/uL (ref 150–400)
RBC: 4.66 MIL/uL (ref 3.87–5.11)
RDW: 11.8 % (ref 11.5–15.5)
WBC: 3.2 10*3/uL — ABNORMAL LOW (ref 4.0–10.5)
nRBC: 0 % (ref 0.0–0.2)

## 2018-07-30 LAB — LIPASE, BLOOD: Lipase: 29 U/L (ref 11–51)

## 2018-07-30 LAB — I-STAT BETA HCG BLOOD, ED (MC, WL, AP ONLY): I-stat hCG, quantitative: <5 m[IU]/mL (ref <5)

## 2018-07-30 MED ORDER — ONDANSETRON HCL 4 MG/2ML IJ SOLN
4.0000 mg | Freq: Once | INTRAMUSCULAR | Status: AC
  Administered 2018-07-30: 4 mg via INTRAVENOUS
  Filled 2018-07-30: qty 2

## 2018-07-30 MED ORDER — ONDANSETRON HCL 4 MG PO TABS: 4.0000 mg | ORAL_TABLET | Freq: Four times a day (QID) | ORAL | 0 refills | Status: DC

## 2018-07-30 MED ORDER — SODIUM CHLORIDE 0.9 % IV BOLUS
1000.0000 mL | Freq: Once | INTRAVENOUS | Status: AC
  Administered 2018-07-30: 1000 mL via INTRAVENOUS

## 2018-07-30 MED ORDER — ACETAMINOPHEN 325 MG PO TABS
650.0000 mg | ORAL_TABLET | Freq: Once | ORAL | Status: AC
  Administered 2018-07-30: 650 mg via ORAL
  Filled 2018-07-30: qty 2

## 2018-07-30 MED ORDER — SODIUM CHLORIDE 0.9% FLUSH: 3.0000 mL | Freq: Once | INTRAVENOUS | Status: DC

## 2018-07-30 NOTE — ED Triage Notes (Signed)
Pt presents from home viaPOV with HA, diarrhea (since last week, 5x daily),dizziness,lossofsmell andtaste,and nausea x2 days.

## 2018-07-30 NOTE — ED Provider Notes (Signed)
[facility] EMERGENCY DEPARTMENT Provider Note   CSN: [id number] Arrival date & time: 07/30/18  1620    History   Chief Complaint Chief Complaint  Patient presents with  . Dizziness  . Diarrhea    HPI Amber Dickerson is a 33 y.o. female.     HPI   33 year old female presents today with several complaints.  Patient notes yesterday she noted she had lost taste.  She also notes she cannot smell.  She notes he minor itchy throat that turned into mucus.  She denies any productive cough or fever, but does note she felt hot last night.  She notes some minor nausea, no vomiting but notes several episodes of nonbloody diarrhea.  She denies any associated abdominal pain.  She denies any close sick contacts abnormal food or drink.  She reports she is otherwise healthy with no significant past medical history.  History reviewed. No pertinent past medical history.  There are no active problems to display for this patient.   Past Surgical History:  Procedure Laterality Date  . ELBOW SURGERY       OB History    Gravida  1   Para  1   Term  1   Preterm      AB      Living  1     SAB      TAB      Ectopic      Multiple      Live Births               Home Medications    Prior to Admission medications   Medication Sig Start Date End Date Taking? Authorizing Provider  famotidine (PEPCID) 20 MG tablet Take 1 tablet (20 mg total) by mouth 2 (two) times daily. Patient not taking: Reported on 07/06/2017 06/05/17   Jacqlyn Larsen, PA-C  hydrOXYzine (ATARAX/VISTARIL) 25 MG tablet Take 1 tablet (25 mg total) by mouth every 6 (six) hours. 01/28/18   Frederica Kuster, PA-C  ondansetron (ZOFRAN ODT) 4 MG disintegrating tablet 4mg  ODT q4 hours prn nausea/vomit Patient not taking: Reported on 07/06/2017 06/05/17   Jacqlyn Larsen, PA-C  ondansetron (ZOFRAN) 4 MG tablet Take 1 tablet (4 mg total) by mouth every 6 (six) hours. 07/30/18   Okey Regal, PA-C     Family History History reviewed. No pertinent family history.  Social History Social History   Tobacco Use  . Smoking status: Former Smoker    Packs/day: 0.00  . Smokeless tobacco: Never Used  Substance Use Topics  . Alcohol use: Yes    Comment: occasional  . Drug use: No     Allergies   Patient has no known allergies.   Review of Systems Review of Systems  All other systems reviewed and are negative.  Physical Exam Updated Vital Signs BP 137/81   Pulse 84   Temp 98.6 F (37 C) (Oral)   Resp 16   Ht 5\' 5"  (1.651 m)   Wt 62.6 kg   SpO2 97%   BMI 22.96 kg/m   Physical Exam Vitals signs and nursing note reviewed.  Constitutional:      Appearance: She is well-developed.  HENT:     Head: Normocephalic and atraumatic.  Eyes:     General: No scleral icterus.       Right eye: No discharge.        Left eye: No discharge.     Conjunctiva/sclera: Conjunctivae normal.  Pupils: Pupils are equal, round, and reactive to light.  Neck:     Musculoskeletal: Normal range of motion.     Vascular: No JVD.     Trachea: No tracheal deviation.  Pulmonary:     Effort: Pulmonary effort is normal. No respiratory distress.     Breath sounds: Normal breath sounds. No stridor. No wheezing.  Abdominal:     General: There is no distension.     Palpations: Abdomen is soft. There is no mass.     Tenderness: There is no abdominal tenderness. There is no guarding.  Neurological:     Mental Status: She is alert and oriented to person, place, and time.     Coordination: Coordination normal.  Psychiatric:        Behavior: Behavior normal.        Thought Content: Thought content normal.        Judgment: Judgment normal.    ED Treatments / Results  Labs (all labs ordered are listed, but only abnormal results are displayed) Labs Reviewed  COMPREHENSIVE METABOLIC PANEL - Abnormal; Notable for the following components:      Result Value   Glucose, Bld 137 (*)    Creatinine, Ser  0.36 (*)    Albumin 3.4 (*)    Alkaline Phosphatase 208 (*)    All other components within normal limits  CBC - Abnormal; Notable for the following components:   WBC 3.2 (*)    All other components within normal limits  NOVEL CORONAVIRUS, NAA (HOSPITAL ORDER, SEND-OUT TO REF LAB)  LIPASE, BLOOD  URINALYSIS, ROUTINE W REFLEX MICROSCOPIC  I-STAT BETA HCG BLOOD, ED ([facility], [facility], [facility] ONLY)    EKG None  Radiology No results found.  Procedures Procedures (including critical care time)  Medications Ordered in ED Medications  ondansetron (ZOFRAN) injection 4 mg (4 mg Intravenous Given 07/30/18 2006)  sodium chloride 0.9 % bolus 1,000 mL (0 mLs Intravenous Stopped 07/30/18 2123)  acetaminophen (TYLENOL) tablet 650 mg (650 mg Oral Given 07/30/18 2006)     Initial Impression / Assessment and Plan / ED Course  I have reviewed the triage vital signs and the nursing notes.  Pertinent labs & imaging results that were available during my care of the patient were reviewed by me and considered in my medical decision making (see chart for details).         Assessment/Plan: 33 year old female presents today with likely viral illness she is well-appearing no acute distress.  Low suspicion for COVID 19, COVID testing sent off.  Discharged home with symptomatic care and return precautions.   Final Clinical Impressions(s) / ED Diagnoses   Final diagnoses:  Viral illness    ED Discharge Orders         Ordered    ondansetron (ZOFRAN) 4 MG tablet  Every 6 hours     07/30/18 2055           Eyvonne MechanicHedges, Yulissa Needham, PA-C 07/31/18 1817    Gerhard MunchLockwood, Robert, MD 08/05/18 1924

## 2018-07-30 NOTE — Discharge Instructions (Signed)
Please read attached information. If you experience any new or worsening signs or symptoms please return to the emergency room for evaluation. Please follow-up with your primary care provider or specialist as discussed. Please use medication prescribed only as directed and discontinue taking if you have any concerning signs or symptoms.   °

## 2018-07-30 NOTE — ED Notes (Signed)
Patient Alert and oriented to baseline. Stable and ambulatory to baseline. Patient verbalized understanding of the discharge instructions.  Patient belongings were taken by the patient.   

## 2018-08-01 LAB — NOVEL CORONAVIRUS, NAA (HOSP ORDER, SEND-OUT TO REF LAB; TAT 18-24 HRS): SARS-CoV-2, NAA: DETECTED — AB

## 2018-08-01 NOTE — Addendum Note (Signed)
Addended by: Reily Ilic M on: 08/01/2018 06:51 PM   Modules accepted: Orders  

## 2018-08-07 ENCOUNTER — Telehealth (HOSPITAL_COMMUNITY): Payer: Self-pay

## 2018-08-24 ENCOUNTER — Emergency Department (HOSPITAL_BASED_OUTPATIENT_CLINIC_OR_DEPARTMENT_OTHER)
Admission: EM | Admit: 2018-08-24 | Discharge: 2018-08-24 | Disposition: A | Discharge status: Home / Self Care | Payer: Self-pay | Attending: Emergency Medicine | Admitting: Emergency Medicine

## 2018-08-24 ENCOUNTER — Encounter (HOSPITAL_BASED_OUTPATIENT_CLINIC_OR_DEPARTMENT_OTHER): Payer: Self-pay | Admitting: Emergency Medicine

## 2018-08-24 ENCOUNTER — Other Ambulatory Visit: Payer: Self-pay

## 2018-08-24 DIAGNOSIS — Z79899 Other long term (current) drug therapy: Secondary | ICD-10-CM | POA: Insufficient documentation

## 2018-08-24 DIAGNOSIS — B9689 Other specified bacterial agents as the cause of diseases classified elsewhere: Secondary | ICD-10-CM

## 2018-08-24 DIAGNOSIS — A599 Trichomoniasis, unspecified: Secondary | ICD-10-CM | POA: Insufficient documentation

## 2018-08-24 DIAGNOSIS — N760 Acute vaginitis: Secondary | ICD-10-CM | POA: Insufficient documentation

## 2018-08-24 DIAGNOSIS — Z87891 Personal history of nicotine dependence: Secondary | ICD-10-CM | POA: Insufficient documentation

## 2018-08-24 LAB — URINALYSIS, ROUTINE W REFLEX MICROSCOPIC
Bilirubin Urine: NEGATIVE
Glucose, UA: NEGATIVE mg/dL
Ketones, ur: NEGATIVE mg/dL
Nitrite: NEGATIVE
Protein, ur: NEGATIVE mg/dL
Specific Gravity, Urine: 1.02 (ref 1.005–1.030)
pH: 6 (ref 5.0–8.0)

## 2018-08-24 LAB — URINALYSIS, MICROSCOPIC (REFLEX)

## 2018-08-24 LAB — WET PREP, GENITAL
Sperm: NONE SEEN
Trich, Wet Prep: NONE SEEN
Yeast Wet Prep HPF POC: NONE SEEN

## 2018-08-24 LAB — PREGNANCY, URINE: Preg Test, Ur: NEGATIVE

## 2018-08-24 MED ORDER — METRONIDAZOLE 500 MG PO TABS: 500.0000 mg | ORAL_TABLET | Freq: Two times a day (BID) | ORAL | 0 refills | Status: AC

## 2018-08-24 NOTE — ED Triage Notes (Signed)
Pt c/o vaginal discharge with itching and burning x 1 week. Denies abdominal pain, denies dysuria.

## 2018-08-24 NOTE — ED Notes (Signed)
Pt verbalized understanding of dc instructions.

## 2018-08-24 NOTE — ED Provider Notes (Signed)
[place] EMERGENCY DEPARTMENT Provider Note   CSN: [id number] Arrival date & time: 08/24/18  1102    History   Chief Complaint Chief Complaint  Patient presents with  . Vaginal Discharge    HPI Amber Dickerson is a 33 y.o. female.  Complaints: Vaginal Discharge  Patient reports that discharge started 7 days ago.  She notes that discharge appears clear/white and thin.  She denies vaginal odors.  She endorses vaginal itching and irritation.  She denies abnormal vaginal bleeding, dysuria, hematuria, pelvic pain, nausea, vomiting, fevers.   She has not tried any OTC treatments.  She is sexually active, no birth control, would like to be tested for STDs.  LMP 8/3, stopped bleeding yesterday, no intercourse in last 7 days.     History reviewed. No pertinent past medical history.  There are no active problems to display for this patient.   Past Surgical History:  Procedure Laterality Date  . ELBOW SURGERY       OB History    Gravida  1   Para  1   Term  1   Preterm      AB      Living  1     SAB      TAB      Ectopic      Multiple      Live Births               Home Medications    Prior to Admission medications   Medication Sig Start Date End Date Taking? Authorizing Provider  famotidine (PEPCID) 20 MG tablet Take 1 tablet (20 mg total) by mouth 2 (two) times daily. Patient not taking: Reported on 07/06/2017 06/05/17   Jacqlyn Larsen, PA-C  hydrOXYzine (ATARAX/VISTARIL) 25 MG tablet Take 1 tablet (25 mg total) by mouth every 6 (six) hours. 01/28/18   Law, Bea Graff, PA-C  metroNIDAZOLE (FLAGYL) 500 MG tablet Take 1 tablet (500 mg total) by mouth 2 (two) times daily for 7 days. 08/24/18 08/31/18  Meccariello, Bernita Raisin, DO  ondansetron (ZOFRAN ODT) 4 MG disintegrating tablet 4mg  ODT q4 hours prn nausea/vomit Patient not taking: Reported on 07/06/2017 06/05/17   Jacqlyn Larsen, PA-C  ondansetron (ZOFRAN) 4 MG tablet Take 1 tablet (4 mg total) by mouth  every 6 (six) hours. 07/30/18   Okey Regal, PA-C    Family History History reviewed. No pertinent family history.  Social History Social History   Tobacco Use  . Smoking status: Former Smoker    Packs/day: 0.00  . Smokeless tobacco: Never Used  Substance Use Topics  . Alcohol use: Yes    Comment: occasional  . Drug use: Not Currently    Types: Marijuana     Allergies   Patient has no known allergies.   Review of Systems Review of Systems  Constitutional: Negative for chills and fever.  Respiratory: Negative for cough and shortness of breath.   Cardiovascular: Negative for chest pain.  Gastrointestinal: Negative for abdominal pain, nausea and vomiting.  Genitourinary: Positive for vaginal discharge. Negative for difficulty urinating, dysuria, hematuria, menstrual problem, pelvic pain, vaginal bleeding and vaginal pain.     Physical Exam Updated Vital Signs BP 139/87 (BP Location: Left Arm)   Pulse 99   Temp 98.5 F (36.9 C) (Oral)   Resp 18   Ht 5\' 5"  (1.651 m)   Wt 66.2 kg   LMP 08/19/2018 (Exact Date)   SpO2 98%   BMI 24.30 kg/m  Physical Exam Constitutional:      General: She is not in acute distress.    Appearance: Normal appearance. She is not ill-appearing.  HENT:     Head: Normocephalic and atraumatic.  Cardiovascular:     Rate and Rhythm: Normal rate and regular rhythm.  Pulmonary:     Effort: Pulmonary effort is normal.     Breath sounds: Normal breath sounds.  Abdominal:     General: Abdomen is flat. Bowel sounds are normal.     Palpations: Abdomen is soft.     Tenderness: There is abdominal tenderness (mild, suprapubic).  Skin:    General: Skin is warm and dry.  Neurological:     Mental Status: She is alert and oriented to person, place, and time.  Psychiatric:        Mood and Affect: Mood normal.        Behavior: Behavior normal.   GU: Pelvic exam performed with patient supine.  Chaperone in room.  Bilateral labia without  abnormalities, no inguinal LAD palpated.  Cervix exhibits scant yellow/bloody discharge, no cervix abnormalities, nabothian cyst at 3 o' clock.  No vaginal lesions.  Vaginal discharge minimal.  Bimanual exam performed without cervical motion tenderness, no uterine masses palpated.    ED Treatments / Results  Labs (all labs ordered are listed, but only abnormal results are displayed) Labs Reviewed  WET PREP, GENITAL - Abnormal; Notable for the following components:      Result Value   Clue Cells Wet Prep HPF POC PRESENT (*)    WBC, Wet Prep HPF POC MANY (*)    All other components within normal limits  URINALYSIS, ROUTINE W REFLEX MICROSCOPIC - Abnormal; Notable for the following components:   Hgb urine dipstick TRACE (*)    Leukocytes,Ua SMALL (*)    All other components within normal limits  URINALYSIS, MICROSCOPIC (REFLEX) - Abnormal; Notable for the following components:   Bacteria, UA MANY (*)    Trichomonas, UA PRESENT (*)    All other components within normal limits  URINE CULTURE  PREGNANCY, URINE  HIV ANTIBODY (ROUTINE TESTING W REFLEX)  RPR  GC/CHLAMYDIA PRO[facility] AMP [facility]    EKG None  Radiology No results found.  Procedures Procedures (including critical care time)  Medications Ordered in ED Medications - No data to display   Initial Impression / Assessment and Plan / ED Course  I have reviewed the triage vital signs and the nursing notes.  Pertinent labs & imaging results that were available during my care of the patient were reviewed by me and considered in my medical decision making (see chart for details).  Cleda MccreedyBrandi Curtiss is a 33 year old female who presents with 7 days of vaginal discharge, irritation.  On exam, patient does have suprapubic tenderness, therefore will obtain urinalysis.  Patient requests STD testing, therefore will obtain gonorrhea/, Lydia, HIV, RPR, wet prep.  Vaginal exam without obviously abnormalities.  Wet prep  consistent with bacterial vaginosis, UA positive for trichomonas.  Both treatments a.m., patient given metronidazole 500 mg twice daily for 7 days, advised to avoid alcohol.  She voiced understanding of this.  Patient also advised to avoid intercourse until she and her partner are both treated.  Advised for her partner to also be seen and tested for STDs.  She voiced understanding of this, agreed to plan.  Vital signs are stable, okay to discharge home.  Return precautions discussed with patient, she voiced understanding.  Final Clinical Impressions(s) / ED  Diagnoses   Final diagnoses:  Bacterial vaginosis  Trichomonas infection    ED Discharge Orders         Ordered    metroNIDAZOLE (FLAGYL) 500 MG tablet  2 times daily     08/24/18 1223           Unknown JimMeccariello, Bailey J, DO 08/24/18 1253    Maia PlanLong, Joshua G, MD 08/24/18 2034

## 2018-08-24 NOTE — Discharge Instructions (Signed)
Your vaginal swab was positive for bacterial vaginosis, your urine also had trichomonas in it, therefore we will treat you with Flagyl 500 mg twice a day for the next 7 days.  Is important that you abstain from sexual intercourse until you and your partner are completely treated.  Your partner should be seen and tested for STDs as well.  You will be contacted in the next few days on your results of gonorrhea, chlamydia, HIV, RPR result.  You should return to care if you have worsening of discharge, pain, fevers, chills, vomiting.  Do not drink with this medication, as it can cause a reaction.

## 2018-08-25 LAB — URINE CULTURE

## 2018-08-25 LAB — RPR: RPR Ser Ql: NONREACTIVE

## 2018-08-25 LAB — HIV ANTIBODY (ROUTINE TESTING W REFLEX): HIV Screen 4th Generation wRfx: NONREACTIVE

## 2018-08-27 LAB — GC/CHLAMYDIA PROBE AMP (~~LOC~~) NOT AT ARMC
Chlamydia: NEGATIVE
Neisseria Gonorrhea: NEGATIVE

## 2018-10-15 ENCOUNTER — Other Ambulatory Visit: Payer: Self-pay

## 2018-10-15 ENCOUNTER — Encounter (HOSPITAL_BASED_OUTPATIENT_CLINIC_OR_DEPARTMENT_OTHER): Payer: Self-pay

## 2018-10-15 ENCOUNTER — Emergency Department (HOSPITAL_BASED_OUTPATIENT_CLINIC_OR_DEPARTMENT_OTHER)
Admission: EM | Admit: 2018-10-15 | Discharge: 2018-10-16 | Disposition: A | Discharge status: Home / Self Care | Payer: Self-pay | Attending: Emergency Medicine | Admitting: Emergency Medicine

## 2018-10-15 DIAGNOSIS — H53149 Visual discomfort, unspecified: Secondary | ICD-10-CM | POA: Insufficient documentation

## 2018-10-15 DIAGNOSIS — R51 Headache: Secondary | ICD-10-CM | POA: Insufficient documentation

## 2018-10-15 DIAGNOSIS — Z87891 Personal history of nicotine dependence: Secondary | ICD-10-CM | POA: Insufficient documentation

## 2018-10-15 DIAGNOSIS — G4489 Other headache syndrome: Secondary | ICD-10-CM | POA: Insufficient documentation

## 2018-10-15 DIAGNOSIS — R519 Headache, unspecified: Secondary | ICD-10-CM

## 2018-10-15 DIAGNOSIS — Z79899 Other long term (current) drug therapy: Secondary | ICD-10-CM | POA: Insufficient documentation

## 2018-10-15 LAB — PREGNANCY, URINE: Preg Test, Ur: NEGATIVE

## 2018-10-15 MED ORDER — SODIUM CHLORIDE 0.9 % IV BOLUS
1000.0000 mL | Freq: Once | INTRAVENOUS | Status: AC
  Administered 2018-10-15: 1000 mL via INTRAVENOUS

## 2018-10-15 MED ORDER — DROPERIDOL 2.5 MG/ML IJ SOLN
1.2500 mg | Freq: Once | INTRAMUSCULAR | Status: AC
  Administered 2018-10-15: 1.25 mg via INTRAVENOUS
  Filled 2018-10-15: qty 2

## 2018-10-15 MED ORDER — KETOROLAC TROMETHAMINE 30 MG/ML IJ SOLN
30.0000 mg | Freq: Once | INTRAMUSCULAR | Status: AC
  Administered 2018-10-15: 30 mg via INTRAVENOUS
  Filled 2018-10-15: qty 1

## 2018-10-15 MED ORDER — ACETAMINOPHEN 500 MG PO TABS
1000.0000 mg | ORAL_TABLET | Freq: Once | ORAL | Status: AC
  Administered 2018-10-15: 1000 mg via ORAL
  Filled 2018-10-15: qty 2

## 2018-10-15 NOTE — ED Notes (Signed)
ED Provider at bedside. 

## 2018-10-15 NOTE — Discharge Instructions (Addendum)

## 2018-10-15 NOTE — ED Provider Notes (Signed)
[facility] Provider Note   CSN: [id number] Arrival date & time: 10/15/18  1811     History   Chief Complaint Chief Complaint  Patient presents with  . Headache    HPI Amber Dickerson is a 33 y.o. female.     33 year old female who presents with headache.  Patient had gradual onset of headache a few hours ago associated with photophobia.  She reports that she frequently has headaches and in the past has tried [employer] and Tylenol but has not taken any medications yet today.  She denies any nausea, vomiting, vision changes, weakness, fevers, or recent illness.  She had COVID-19 back in June but recovered completely.  She has noted elevated blood pressure here, denies history of hypertension diagnosis.  The history is provided by the patient.  Headache   History reviewed. No pertinent past medical history.  There are no active problems to display for this patient.   Past Surgical History:  Procedure Laterality Date  . ELBOW SURGERY       OB History    Gravida  1   Para  1   Term  1   Preterm      AB      Living  1     SAB      TAB      Ectopic      Multiple      Live Births               Home Medications    Prior to Admission medications   Medication Sig Start Date End Date Taking? Authorizing Provider  famotidine (PEPCID) 20 MG tablet Take 1 tablet (20 mg total) by mouth 2 (two) times daily. Patient not taking: Reported on 07/06/2017 06/05/17   Jacqlyn Larsen, PA-C  hydrOXYzine (ATARAX/VISTARIL) 25 MG tablet Take 1 tablet (25 mg total) by mouth every 6 (six) hours. 01/28/18   Frederica Kuster, PA-C  ondansetron (ZOFRAN ODT) 4 MG disintegrating tablet 4mg  ODT q4 hours prn nausea/vomit Patient not taking: Reported on 07/06/2017 06/05/17   Jacqlyn Larsen, PA-C  ondansetron (ZOFRAN) 4 MG tablet Take 1 tablet (4 mg total) by mouth every 6 (six) hours. 07/30/18   Okey Regal, PA-C    Family History No family history on  file.  Social History Social History   Tobacco Use  . Smoking status: Former Smoker    Packs/day: 0.00  . Smokeless tobacco: Never Used  Substance Use Topics  . Alcohol use: Yes    Comment: occasional  . Drug use: Not Currently    Types: Marijuana     Allergies   Patient has no known allergies.   Review of Systems Review of Systems  Neurological: Positive for headaches.   All other systems reviewed and are negative except that which was mentioned in HPI   Physical Exam Updated Vital Signs BP (!) 184/78 (BP Location: Right Arm)   Pulse (!) 104   Temp 98.1 F (36.7 C) (Oral)   Resp 16   Ht 5\' 5"  (1.651 m)   Wt 66.7 kg   LMP 09/24/2018   SpO2 99%   BMI 24.46 kg/m   Physical Exam Vitals signs and nursing note reviewed.  Constitutional:      General: She is not in acute distress.    Appearance: She is well-developed.     Comments: Awake, alert  HENT:     Head: Normocephalic and atraumatic.  Eyes:  Extraocular Movements: Extraocular movements intact.     Conjunctiva/sclera: Conjunctivae normal.     Pupils: Pupils are equal, round, and reactive to light.  Neck:     Musculoskeletal: Neck supple.  Pulmonary:     Effort: Pulmonary effort is normal.  Skin:    General: Skin is warm and dry.  Neurological:     Mental Status: She is alert and oriented to person, place, and time.     Cranial Nerves: No cranial nerve deficit.     Motor: No abnormal muscle tone.     Deep Tendon Reflexes: Reflexes are normal and symmetric.     Comments: Fluent speech, normal finger-to-nose testing, negative pronator drift, no clonus 5/5 strength and normal sensation x all 4 extremities  Psychiatric:        Thought Content: Thought content normal.        Judgment: Judgment normal.     Comments: Jittery, slightly bizarre affect      ED Treatments / Results  Labs (all labs ordered are listed, but only abnormal results are displayed) Labs Reviewed  PREGNANCY, URINE     EKG None  Radiology No results found.  Procedures Procedures (including critical care time)  Medications Ordered in ED Medications  acetaminophen (TYLENOL) tablet 1,000 mg (has no administration in time range)  sodium chloride 0.9 % bolus 1,000 mL (1,000 mLs Intravenous New Bag/Given 10/15/18 2133)  ketorolac (TORADOL) 30 MG/ML injection 30 mg (30 mg Intravenous Given 10/15/18 2237)     Initial Impression / Assessment and Plan / ED Course  I have reviewed the triage vital signs and the nursing notes.  Pertinent labs that were available during my care of the patient were reviewed by me and considered in my medical decision making (see chart for details).        Neurologically intact on exam.  She was hypertensive, I noted previous ED visits with borderline blood pressures.  I emphasized the importance of seeing a PCP in clinic to have it rechecked as she may need to be medicated if persistently hypertensive.  She has no red flag features of her headache or concerning findings on physical exam to suggest subarachnoid hemorrhage, mass, or other life-threatening process therefore I do not feel she needs imaging at this time.  Gave above migraine cocktail, limited by the fact that she drove herself here and I cannot give any sedating medications.  On reassessment, she is still mildly symptomatic therefore added on Tylenol and small dose of droperidol.  I have signed out to oncoming provider pending reassessment.  If improved, anticipate discharge.  Final Clinical Impressions(s) / ED Diagnoses   Final diagnoses:  None    ED Discharge Orders    None       , Ambrose Finland, MD 10/15/18 2300

## 2018-10-15 NOTE — ED Triage Notes (Signed)
Pt c/o HA x2 hours with photophobia. Hx of migraines.

## 2018-10-16 NOTE — ED Provider Notes (Signed)
Patient feeling improved after medications.  She is appropriate for discharge home.  She was given strict ER return precautions   Ripley Fraise, MD 10/16/18 0201

## 2018-10-16 NOTE — ED Notes (Signed)
Pt is resting quietly with eyes closed  No acute distress noted

## 2019-07-07 ENCOUNTER — Emergency Department (HOSPITAL_BASED_OUTPATIENT_CLINIC_OR_DEPARTMENT_OTHER)
Admission: EM | Admit: 2019-07-07 | Discharge: 2019-07-07 | Disposition: A | Discharge status: Home / Self Care | Payer: Self-pay | Attending: Emergency Medicine | Admitting: Emergency Medicine

## 2019-07-07 ENCOUNTER — Encounter (HOSPITAL_BASED_OUTPATIENT_CLINIC_OR_DEPARTMENT_OTHER): Payer: Self-pay | Admitting: *Deleted

## 2019-07-07 ENCOUNTER — Other Ambulatory Visit: Payer: Self-pay

## 2019-07-07 DIAGNOSIS — Z87891 Personal history of nicotine dependence: Secondary | ICD-10-CM | POA: Insufficient documentation

## 2019-07-07 DIAGNOSIS — B9689 Other specified bacterial agents as the cause of diseases classified elsewhere: Secondary | ICD-10-CM

## 2019-07-07 DIAGNOSIS — N760 Acute vaginitis: Secondary | ICD-10-CM | POA: Insufficient documentation

## 2019-07-07 LAB — URINALYSIS, ROUTINE W REFLEX MICROSCOPIC
Bilirubin Urine: NEGATIVE
Glucose, UA: NEGATIVE mg/dL
Hgb urine dipstick: NEGATIVE
Ketones, ur: NEGATIVE mg/dL
Leukocytes,Ua: NEGATIVE
Nitrite: NEGATIVE
Protein, ur: NEGATIVE mg/dL
Specific Gravity, Urine: 1.02 (ref 1.005–1.030)
pH: 7.5 (ref 5.0–8.0)

## 2019-07-07 LAB — WET PREP, GENITAL
Sperm: NONE SEEN
Trich, Wet Prep: NONE SEEN
Yeast Wet Prep HPF POC: NONE SEEN

## 2019-07-07 LAB — PREGNANCY, URINE: Preg Test, Ur: NEGATIVE

## 2019-07-07 MED ORDER — METRONIDAZOLE 500 MG PO TABS: 500.0000 mg | ORAL_TABLET | Freq: Two times a day (BID) | ORAL | 0 refills | Status: DC

## 2019-07-07 NOTE — Discharge Instructions (Signed)
Take the Flagyl as directed.  Former cultures are pending will be notified if they grow anything.  Return for any new or worse symptoms or if things do not improve.

## 2019-07-07 NOTE — ED Triage Notes (Signed)
C/o vaginal discharge x 3 days.

## 2019-07-07 NOTE — ED Provider Notes (Signed)
[facility] EMERGENCY DEPARTMENT Provider Note   CSN: [id number] Arrival date & time: 07/07/19  1834     History Chief Complaint  Patient presents with  . Vaginal Discharge    Amber Dickerson is a 34 y.o. female.  Patient with complaint of vaginal discharge.  Concerned about having yeast infection or bacterial vaginosis.  Patient not concerned about STD.  Does not want prophylaxis for that.  No abdominal pain no nausea no vomiting.        History reviewed. No pertinent past medical history.  There are no problems to display for this patient.   Past Surgical History:  Procedure Laterality Date  . ELBOW SURGERY       OB History    Gravida  1   Para  1   Term  1   Preterm      AB      Living  1     SAB      TAB      Ectopic      Multiple      Live Births              History reviewed. No pertinent family history.  Social History   Tobacco Use  . Smoking status: Former Smoker    Packs/day: 0.00  . Smokeless tobacco: Never Used  Vaping Use  . Vaping Use: Never used  Substance Use Topics  . Alcohol use: Yes    Comment: occasional  . Drug use: Not Currently    Types: Marijuana    Home Medications Prior to Admission medications   Medication Sig Start Date End Date Taking? Authorizing Provider  famotidine (PEPCID) 20 MG tablet Take 1 tablet (20 mg total) by mouth 2 (two) times daily. Patient not taking: Reported on 07/06/2017 06/05/17   Jacqlyn Larsen, PA-C  hydrOXYzine (ATARAX/VISTARIL) 25 MG tablet Take 1 tablet (25 mg total) by mouth every 6 (six) hours. 01/28/18   Law, Bea Graff, PA-C  metroNIDAZOLE (FLAGYL) 500 MG tablet Take 1 tablet (500 mg total) by mouth 2 (two) times daily. 07/07/19   Fredia Sorrow, MD  ondansetron (ZOFRAN ODT) 4 MG disintegrating tablet 4mg  ODT q4 hours prn nausea/vomit Patient not taking: Reported on 07/06/2017 06/05/17   Jacqlyn Larsen, PA-C  ondansetron (ZOFRAN) 4 MG tablet Take 1 tablet (4 mg total) by  mouth every 6 (six) hours. 07/30/18   Okey Regal, PA-C    Allergies    Patient has no known allergies.  Review of Systems   Review of Systems  Constitutional: Negative for chills and fever.  HENT: Negative for rhinorrhea and sore throat.   Eyes: Negative for visual disturbance.  Respiratory: Negative for cough and shortness of breath.   Cardiovascular: Negative for chest pain and leg swelling.  Gastrointestinal: Negative for abdominal pain, diarrhea, nausea and vomiting.  Genitourinary: Positive for vaginal discharge. Negative for dysuria.  Musculoskeletal: Negative for back pain and neck pain.  Skin: Negative for rash.  Neurological: Negative for dizziness, light-headedness and headaches.  Hematological: Does not bruise/bleed easily.  Psychiatric/Behavioral: Negative for confusion.    Physical Exam Updated Vital Signs BP (!) 153/89   Pulse (!) 108   Temp 98.4 F (36.9 C) (Oral)   Resp 18   LMP 06/06/2019   SpO2 98%   Physical Exam Vitals and nursing note reviewed.  Constitutional:      General: She is not in acute distress.    Appearance: Normal appearance. She is well-developed.  HENT:     Head: Normocephalic and atraumatic.  Eyes:     Conjunctiva/sclera: Conjunctivae normal.     Pupils: Pupils are equal, round, and reactive to light.  Cardiovascular:     Rate and Rhythm: Normal rate and regular rhythm.     Heart sounds: No murmur heard.   Pulmonary:     Effort: Pulmonary effort is normal. No respiratory distress.     Breath sounds: Normal breath sounds.  Abdominal:     Palpations: Abdomen is soft.     Tenderness: There is no abdominal tenderness.  Genitourinary:    General: Normal vulva.     Comments: Slight whitish discharge.  No cervical motion tenderness.  Uterus nontender adnexa nontender.  External genitalia normal. Musculoskeletal:        General: No swelling.     Cervical back: Neck supple.  Skin:    General: Skin is warm and dry.      Capillary Refill: Capillary refill takes less than 2 seconds.  Neurological:     General: No focal deficit present.     Mental Status: She is alert and oriented to person, place, and time.     ED Results / Procedures / Treatments   Labs (all labs ordered are listed, but only abnormal results are displayed) Labs Reviewed  WET PREP, GENITAL - Abnormal; Notable for the following components:      Result Value   Clue Cells Wet Prep HPF POC PRESENT (*)    WBC, Wet Prep HPF POC FEW (*)    All other components within normal limits  URINALYSIS, ROUTINE W REFLEX MICROSCOPIC  PREGNANCY, URINE  GC/CHLAMYDIA PROBE AMP ([place]) NOT AT [facility]    EKG None  Radiology No results found.  Procedures Procedures (including critical care time)  Medications Ordered in ED Medications - No data to display  ED Course  I have reviewed the triage vital signs and the nursing notes.  Pertinent labs & imaging results that were available during my care of the patient were reviewed by me and considered in my medical decision making (see chart for details).    MDM Rules/Calculators/A&P                         Patient did not want RPR or HIV testing.  Did not want STD prophylaxis.  Wet prep shows some evidence of clue cells.  But pretty minimal discharge.  Patient does want treatment with Flagyl for that.  Formal cultures are done and pending.  Pregnancy test negative.  Urinalysis without evidence for urinary tract infection.   Final Clinical Impression(s) / ED Diagnoses Final diagnoses:  BV (bacterial vaginosis)    Rx / DC Orders ED Discharge Orders         Ordered    metroNIDAZOLE (FLAGYL) 500 MG tablet  2 times daily     Discontinue  Reprint     07/07/19 2103           Vanetta Mulders, MD 07/07/19 2105

## 2019-07-08 LAB — GC/CHLAMYDIA PROBE AMP (~~LOC~~) NOT AT ARMC
Chlamydia: NEGATIVE
Comment: NEGATIVE
Comment: NORMAL
Neisseria Gonorrhea: NEGATIVE

## 2019-08-19 ENCOUNTER — Encounter (HOSPITAL_BASED_OUTPATIENT_CLINIC_OR_DEPARTMENT_OTHER): Payer: Self-pay | Admitting: Emergency Medicine

## 2019-08-19 ENCOUNTER — Other Ambulatory Visit: Payer: Self-pay

## 2019-08-19 ENCOUNTER — Emergency Department (HOSPITAL_BASED_OUTPATIENT_CLINIC_OR_DEPARTMENT_OTHER)
Admission: EM | Admit: 2019-08-19 | Discharge: 2019-08-19 | Disposition: A | Discharge status: Home / Self Care | Payer: Self-pay | Attending: Emergency Medicine | Admitting: Emergency Medicine

## 2019-08-19 DIAGNOSIS — Z87891 Personal history of nicotine dependence: Secondary | ICD-10-CM | POA: Insufficient documentation

## 2019-08-19 DIAGNOSIS — N898 Other specified noninflammatory disorders of vagina: Secondary | ICD-10-CM | POA: Insufficient documentation

## 2019-08-19 LAB — URINALYSIS, ROUTINE W REFLEX MICROSCOPIC
Bilirubin Urine: NEGATIVE
Glucose, UA: NEGATIVE mg/dL
Hgb urine dipstick: NEGATIVE
Ketones, ur: NEGATIVE mg/dL
Leukocytes,Ua: NEGATIVE
Nitrite: NEGATIVE
Protein, ur: NEGATIVE mg/dL
Specific Gravity, Urine: 1.025 (ref 1.005–1.030)
pH: 6.5 (ref 5.0–8.0)

## 2019-08-19 LAB — PREGNANCY, URINE: Preg Test, Ur: NEGATIVE

## 2019-08-19 LAB — WET PREP, GENITAL
Clue Cells Wet Prep HPF POC: NONE SEEN
Sperm: NONE SEEN
Trich, Wet Prep: NONE SEEN
Yeast Wet Prep HPF POC: NONE SEEN

## 2019-08-19 MED ORDER — METRONIDAZOLE 500 MG PO TABS: 500.0000 mg | ORAL_TABLET | Freq: Two times a day (BID) | ORAL | 0 refills | Status: DC

## 2019-08-19 NOTE — ED Triage Notes (Signed)
Given RX of BV 6/21 here. States never took to [employer], "she lost it". Sx are back, pt just wants RX for BV. Vaginal discharge, no pain or bleeding.

## 2019-08-19 NOTE — ED Provider Notes (Signed)
[facility] EMERGENCY DEPARTMENT Provider Note   CSN: [id number] Arrival date & time: 08/19/19  0035     History Chief Complaint  Patient presents with  . Vaginal Discharge    Amber Dickerson is a 34 y.o. female.  Patient is a 34 year old female presenting with complaints of vaginal discharge.  Patient diagnosed several weeks ago with BV and this feels similar.  She denies any fevers or chills.  She denies any dysuria.  Is sexually active, but with one partner and what she believes to be a monogamous relationship.  She denies any abdominal pain.  The history is provided by the patient.       History reviewed. No pertinent past medical history.  There are no problems to display for this patient.   Past Surgical History:  Procedure Laterality Date  . ELBOW SURGERY       OB History    Gravida  1   Para  1   Term  1   Preterm      AB      Living  1     SAB      TAB      Ectopic      Multiple      Live Births              No family history on file.  Social History   Tobacco Use  . Smoking status: Former Smoker    Packs/day: 0.00  . Smokeless tobacco: Never Used  Vaping Use  . Vaping Use: Never used  Substance Use Topics  . Alcohol use: Yes    Comment: occasional  . Drug use: Not Currently    Types: Marijuana    Home Medications Prior to Admission medications   Medication Sig Start Date End Date Taking? Authorizing Provider  famotidine (PEPCID) 20 MG tablet Take 1 tablet (20 mg total) by mouth 2 (two) times daily. Patient not taking: Reported on 07/06/2017 06/05/17   Dartha Lodge, PA-C  hydrOXYzine (ATARAX/VISTARIL) 25 MG tablet Take 1 tablet (25 mg total) by mouth every 6 (six) hours. 01/28/18   Law, Waylan Boga, PA-C  metroNIDAZOLE (FLAGYL) 500 MG tablet Take 1 tablet (500 mg total) by mouth 2 (two) times daily. 07/07/19   Vanetta Mulders, MD  ondansetron (ZOFRAN ODT) 4 MG disintegrating tablet 4mg  ODT q4 hours prn  nausea/vomit Patient not taking: Reported on 07/06/2017 06/05/17   06/07/17, PA-C  ondansetron (ZOFRAN) 4 MG tablet Take 1 tablet (4 mg total) by mouth every 6 (six) hours. 07/30/18   08/01/18, PA-C    Allergies    Patient has no known allergies.  Review of Systems   Review of Systems  All other systems reviewed and are negative.   Physical Exam Updated Vital Signs BP 115/68 (BP Location: Right Arm)   Pulse 78   Temp 98.2 F (36.8 C) (Oral)   Resp 20   LMP 08/04/2019   SpO2 99%   Physical Exam Vitals and nursing note reviewed.  Constitutional:      General: She is not in acute distress.    Appearance: She is well-developed. She is not diaphoretic.  HENT:     Head: Normocephalic and atraumatic.  Cardiovascular:     Rate and Rhythm: Normal rate and regular rhythm.     Heart sounds: No murmur heard.  No friction rub. No gallop.   Pulmonary:     Effort: Pulmonary effort is normal. No respiratory distress.  Breath sounds: Normal breath sounds. No wheezing.  Abdominal:     General: Bowel sounds are normal. There is no distension.     Palpations: Abdomen is soft.     Tenderness: There is no abdominal tenderness.  Musculoskeletal:        General: Normal range of motion.     Cervical back: Normal range of motion and neck supple.  Skin:    General: Skin is warm and dry.  Neurological:     Mental Status: She is alert and oriented to person, place, and time.     ED Results / Procedures / Treatments   Labs (all labs ordered are listed, but only abnormal results are displayed) Labs Reviewed  WET PREP, GENITAL - Abnormal; Notable for the following components:      Result Value   WBC, Wet Prep HPF POC FEW (*)    All other components within normal limits  PREGNANCY, URINE  URINALYSIS, ROUTINE W REFLEX MICROSCOPIC  GC/CHLAMYDIA PROBE AMP ([place]) NOT AT [facility]    EKG None  Radiology No results found.  Procedures Procedures (including critical care  time)  Medications Ordered in ED Medications - No data to display  ED Course  I have reviewed the triage vital signs and the nursing notes.  Pertinent labs & imaging results that were available during my care of the patient were reviewed by me and considered in my medical decision making (see chart for details).    MDM Rules/Calculators/A&P  Wet prep negative except for few wbc.  This feels similar to her prior BV diagnosis.  Will be treated with Flagyl.  Patient offered treatment for STD, however patient requests to wait on the culture results.  Final Clinical Impression(s) / ED Diagnoses Final diagnoses:  None    Rx / DC Orders ED Discharge Orders    None       Geoffery Lyons, MD 08/19/19 [phone]

## 2019-08-19 NOTE — Discharge Instructions (Addendum)
Begin taking Flagyl as prescribed. ° °We will call you if your cultures indicate you need further treatment or need to take additional action. °

## 2019-08-20 LAB — GC/CHLAMYDIA PROBE AMP (~~LOC~~) NOT AT ARMC
Chlamydia: NEGATIVE
Comment: NEGATIVE
Comment: NORMAL
Neisseria Gonorrhea: NEGATIVE

## 2019-11-08 ENCOUNTER — Encounter (HOSPITAL_BASED_OUTPATIENT_CLINIC_OR_DEPARTMENT_OTHER): Payer: Self-pay | Admitting: Emergency Medicine

## 2019-11-08 ENCOUNTER — Emergency Department (HOSPITAL_BASED_OUTPATIENT_CLINIC_OR_DEPARTMENT_OTHER)
Admission: EM | Admit: 2019-11-08 | Discharge: 2019-11-08 | Disposition: A | Discharge status: Home / Self Care | Payer: Self-pay | Attending: Emergency Medicine | Admitting: Emergency Medicine

## 2019-11-08 ENCOUNTER — Other Ambulatory Visit: Payer: Self-pay

## 2019-11-08 DIAGNOSIS — R309 Painful micturition, unspecified: Secondary | ICD-10-CM | POA: Insufficient documentation

## 2019-11-08 DIAGNOSIS — Z87891 Personal history of nicotine dependence: Secondary | ICD-10-CM | POA: Insufficient documentation

## 2019-11-08 DIAGNOSIS — E860 Dehydration: Secondary | ICD-10-CM | POA: Insufficient documentation

## 2019-11-08 LAB — WET PREP, GENITAL
Clue Cells Wet Prep HPF POC: NONE SEEN
Sperm: NONE SEEN
Trich, Wet Prep: NONE SEEN
Yeast Wet Prep HPF POC: NONE SEEN

## 2019-11-08 LAB — URINALYSIS, ROUTINE W REFLEX MICROSCOPIC
Bilirubin Urine: NEGATIVE
Glucose, UA: NEGATIVE mg/dL
Hgb urine dipstick: NEGATIVE
Ketones, ur: NEGATIVE mg/dL
Leukocytes,Ua: NEGATIVE
Nitrite: NEGATIVE
Protein, ur: NEGATIVE mg/dL
Specific Gravity, Urine: >1.03 — ABNORMAL HIGH (ref 1.005–1.030)
pH: 5.5 (ref 5.0–8.0)

## 2019-11-08 LAB — PREGNANCY, URINE: Preg Test, Ur: NEGATIVE

## 2019-11-08 NOTE — ED Provider Notes (Signed)
[facility] Provider Note: Amber Dell, MD, FACEP  CSN: [id number] MRN: [id number] ARRIVAL: 11/08/19 at 0200 ROOM: [facility]06/[facility]06   CHIEF COMPLAINT  Vaginal Discharge   HISTORY OF PRESENT ILLNESS  11/08/19 2:43 AM Amber Dickerson is a 34 y.o. female with several days of "strong smelling" urine and mild burning with urination.  She has also noticed a vaginal discharge but cannot tell me what color it is.  She denies any vaginal bleeding.  She denies any abdominal pain.   History reviewed. No pertinent past medical history.  Past Surgical History:  Procedure Laterality Date  . ELBOW SURGERY      No family history on file.  Social History   Tobacco Use  . Smoking status: Former Smoker    Packs/day: 0.00  . Smokeless tobacco: Never Used  Vaping Use  . Vaping Use: Never used  Substance Use Topics  . Alcohol use: Yes    Comment: occasional  . Drug use: Not Currently    Types: Marijuana    Prior to Admission medications   Not on File    Allergies Patient has no known allergies.   REVIEW OF SYSTEMS  Negative except as noted here or in the History of Present Illness.   PHYSICAL EXAMINATION  Initial Vital Signs Blood pressure 131/78, pulse 89, temperature 98.5 F (36.9 C), temperature source Oral, resp. rate 16, height 5\' 5"  (1.651 m), weight 66.7 kg, SpO2 98 %.  Examination General: Well-developed, well-nourished female in no acute distress; appearance consistent with age of record HENT: normocephalic; atraumatic Eyes: pupils equal, round and reactive to light; extraocular muscles intact Neck: supple Heart: regular rate and rhythm Lungs: clear to auscultation bilaterally Abdomen: soft; nondistended; nontender; bowel sounds present GU: Normal external genitalia; no vaginal bleeding; no vaginal discharge; no cervical motion tenderness; no adnexal tenderness Extremities: No deformity; full range of motion; pulses normal Neurologic: Awake, alert and  oriented; motor function intact in all extremities and symmetric; no facial droop Skin: Warm and dry Psychiatric: Normal mood and affect   RESULTS  Summary of this visit's results, reviewed and interpreted by myself:   EKG Interpretation  Date/Time:    Ventricular Rate:    PR Interval:    QRS Duration:   QT Interval:    QTC Calculation:   R Axis:     Text Interpretation:        Laboratory Studies: Results for orders placed or performed during the hospital encounter of 11/08/19 (from the past 24 hour(s))  Urinalysis, Routine w reflex microscopic     Status: Abnormal   Collection Time: 11/08/19  2:11 AM  Result Value Ref Range   Color, Urine YELLOW YELLOW   APPearance CLEAR CLEAR   Specific Gravity, Urine >1.030 (H) 1.005 - 1.030   pH 5.5 5.0 - 8.0   Glucose, UA NEGATIVE NEGATIVE mg/dL   Hgb urine dipstick NEGATIVE NEGATIVE   Bilirubin Urine NEGATIVE NEGATIVE   Ketones, ur NEGATIVE NEGATIVE mg/dL   Protein, ur NEGATIVE NEGATIVE mg/dL   Nitrite NEGATIVE NEGATIVE   Leukocytes,Ua NEGATIVE NEGATIVE  Pregnancy, urine     Status: None   Collection Time: 11/08/19  2:11 AM  Result Value Ref Range   Preg Test, Ur NEGATIVE NEGATIVE  Wet prep, genital     Status: Abnormal   Collection Time: 11/08/19  2:45 AM   Specimen: PATH Cytology Cervicovaginal Ancillary Only  Result Value Ref Range   Yeast Wet Prep HPF POC NONE SEEN NONE SEEN  Trich, Wet Prep NONE SEEN NONE SEEN   Clue Cells Wet Prep HPF POC NONE SEEN NONE SEEN   WBC, Wet Prep HPF POC MODERATE (A) NONE SEEN   Sperm NONE SEEN    Imaging Studies: No results found.  ED COURSE and MDM  Nursing notes, initial and subsequent vitals signs, including pulse oximetry, reviewed and interpreted by myself.  Vitals:   11/08/19 0208 11/08/19 0210 11/08/19 0214  BP:  131/78   Pulse:  89   Resp:  16   Temp:   98.5 F (36.9 C)  TempSrc:   Oral  SpO2:  98%   Weight: 66.7 kg    Height: 5\' 5"  (1.651 m)     Medications - No  data to display  No evidence of urinary tract infection or vaginitis as seen on laboratory studies.  Physical examination was normal as well.  Patient advised that her urine appeared concentrated and she may be under hydrated.  PROCEDURES  Procedures   ED DIAGNOSES     ICD-10-CM   1. Dehydration  E86.0        Jaydalee Bardwell, MD 11/08/19 11/10/19

## 2019-11-08 NOTE — ED Triage Notes (Signed)
Pt reports strong smelling urine and vaginal discharge. Denies pain.

## 2019-11-10 LAB — GC/CHLAMYDIA PROBE AMP (~~LOC~~) NOT AT ARMC
Chlamydia: NEGATIVE
Comment: NEGATIVE
Comment: NORMAL
Neisseria Gonorrhea: NEGATIVE

## 2020-03-23 IMAGING — CT CT HEAD W/O CM
3 series · 16 of 47 positions shown, 19 images · non-contrast
Comparison: September 05, 2013

CLINICAL DATA: Chronic headache

EXAM:
CT HEAD WITHOUT CONTRAST
TECHNIQUE: Contiguous axial images were obtained from the base of the skull
through the vertex without intravenous contrast.

[Series 2: head wo · axial · 0.40mm/px · z∈[-144,-19]mm · 10 of 31 slices shown, 13 images]
[im 3/31  brain]
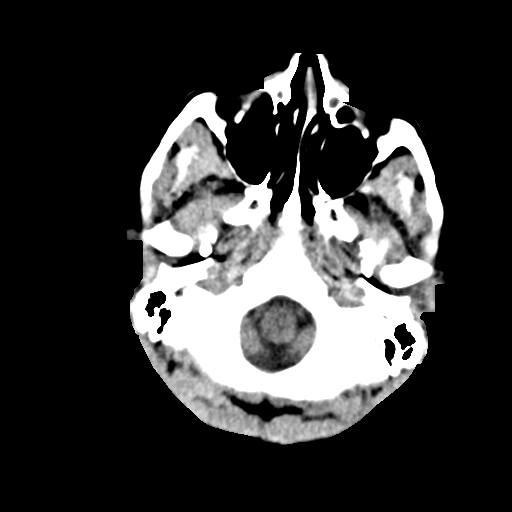
[im 3/31  bone]
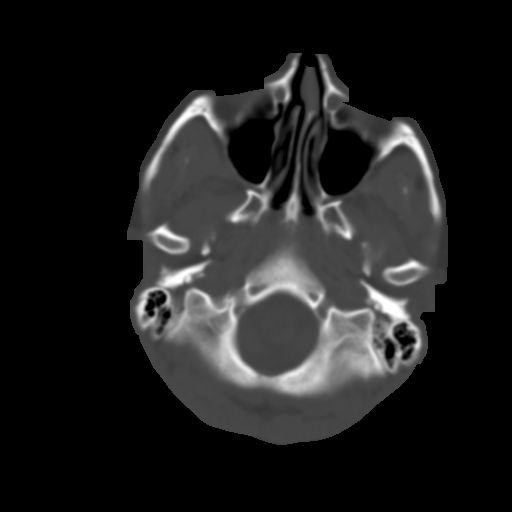
[im 6/31  brain]
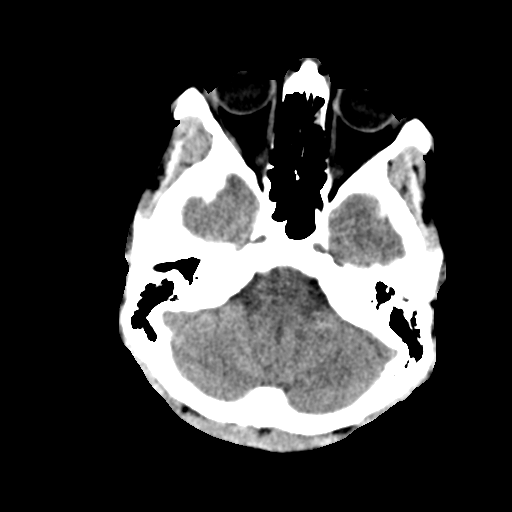
[im 9/31  brain]
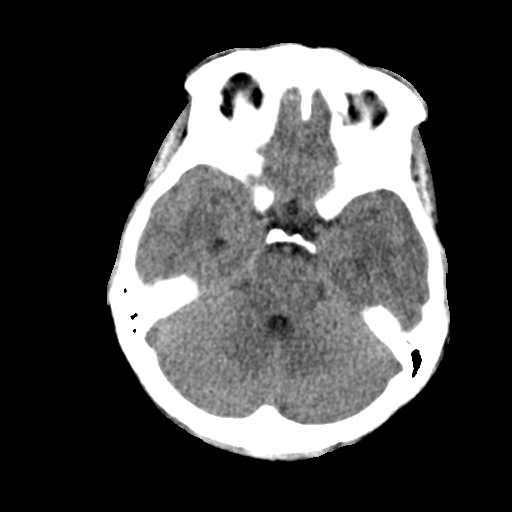
[im 11/31  brain]
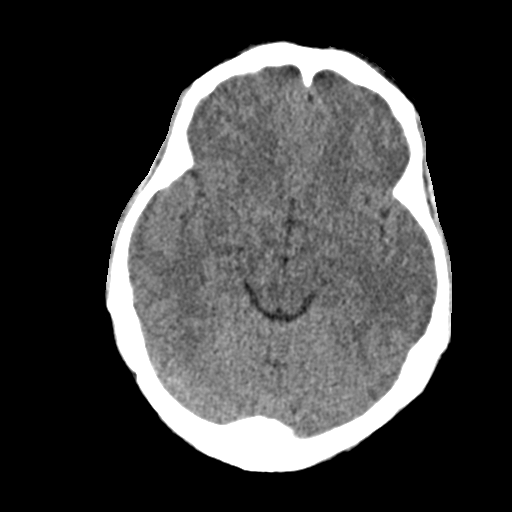
[im 14/31  brain]
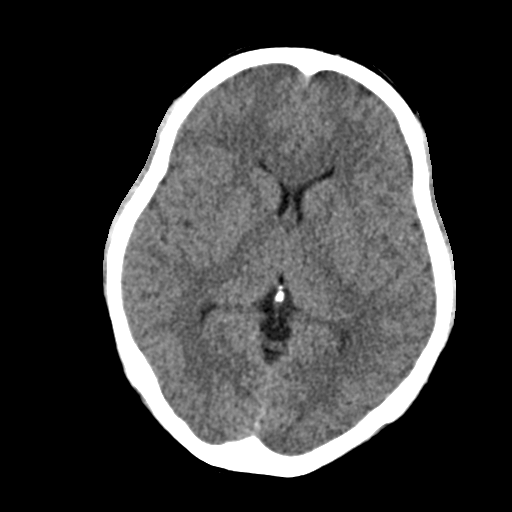
[im 14/31  bone]
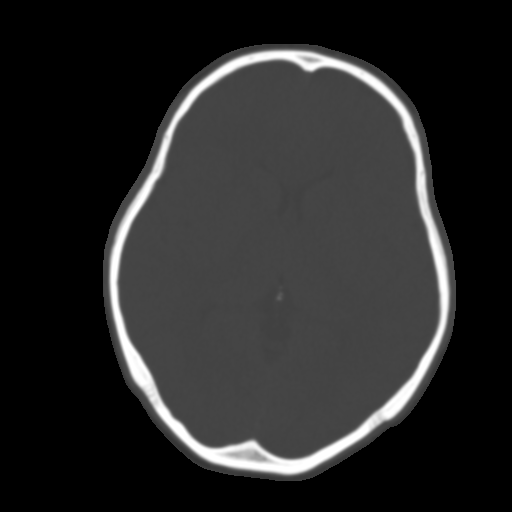
[im 17/31  brain]
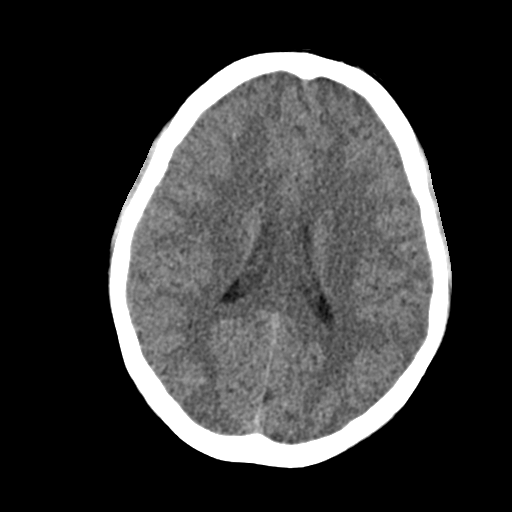
[im 20/31  brain]
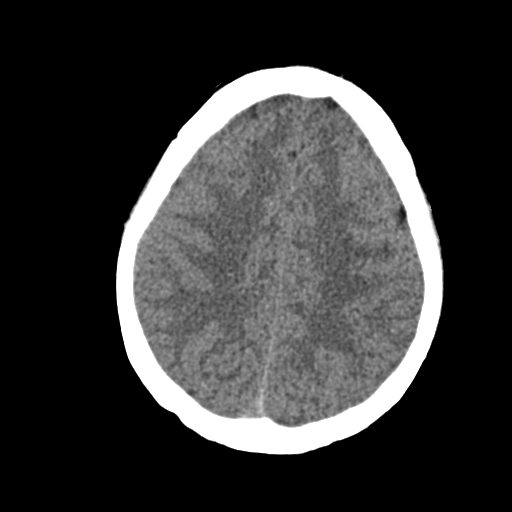
[im 23/31  brain]
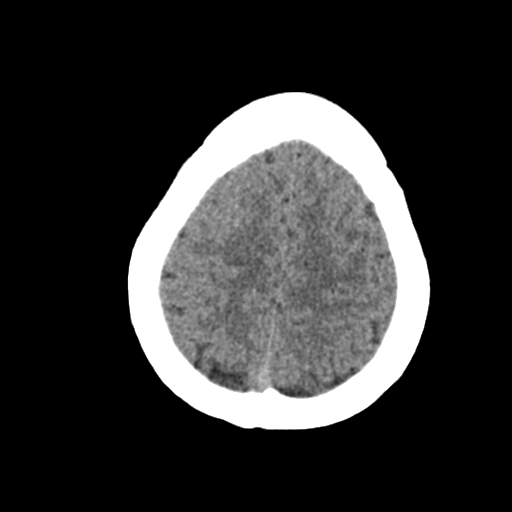
[im 25/31  brain]
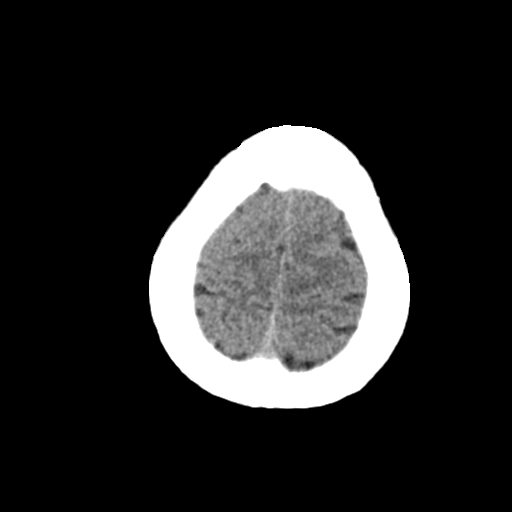
[im 25/31  bone]
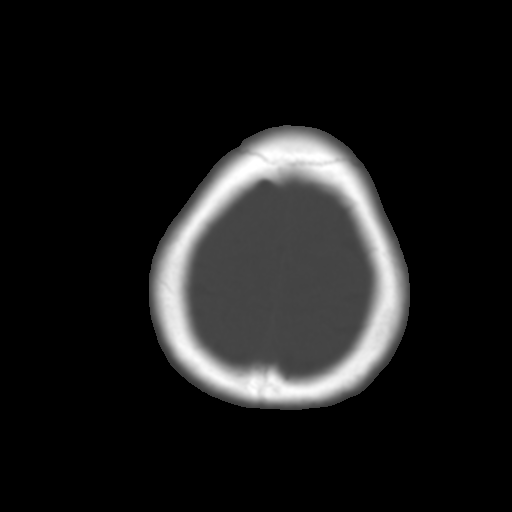
[im 28/31  brain]
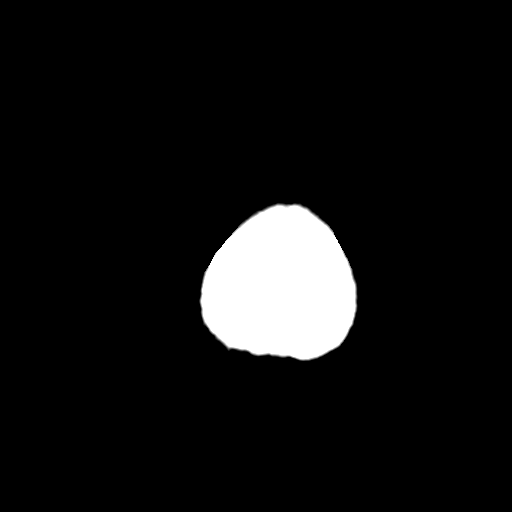

[Series 4: coronal soft · coronal · 0.30mm/px · 3 of 60 slices shown]
[im 20/60  brain]
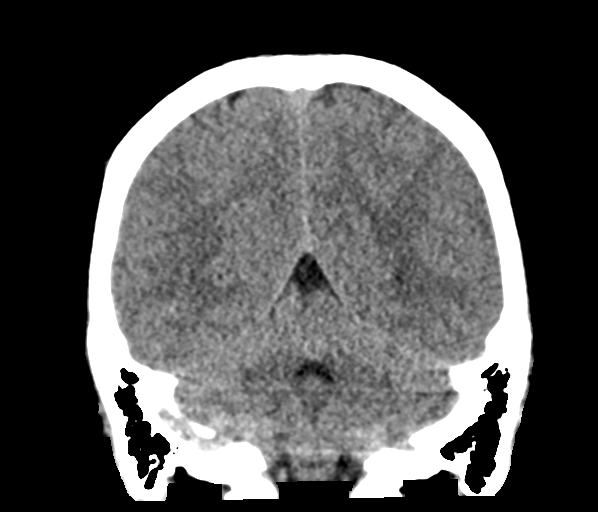
[im 27/60  brain]
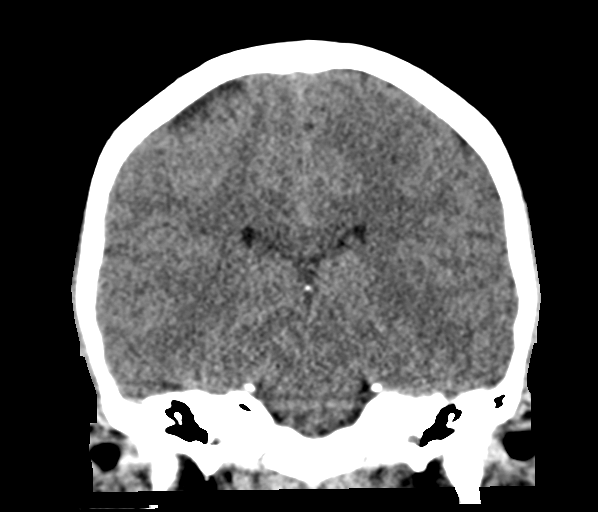
[im 33/60  brain]
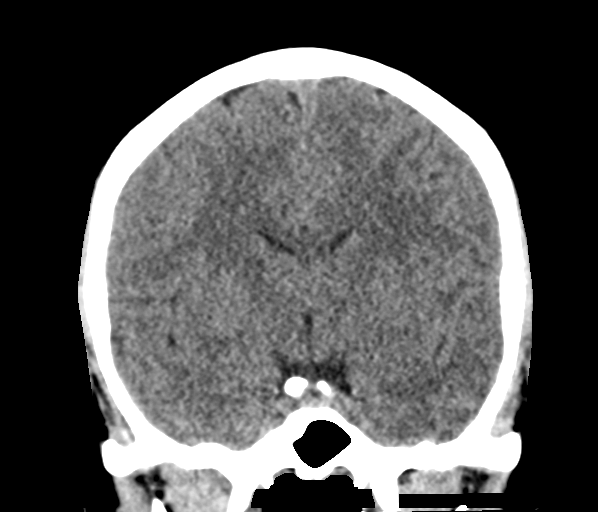

[Series 5: sag soft · sagittal · 0.34mm/px · 3 of 54 slices shown]
[im 18/54  brain]
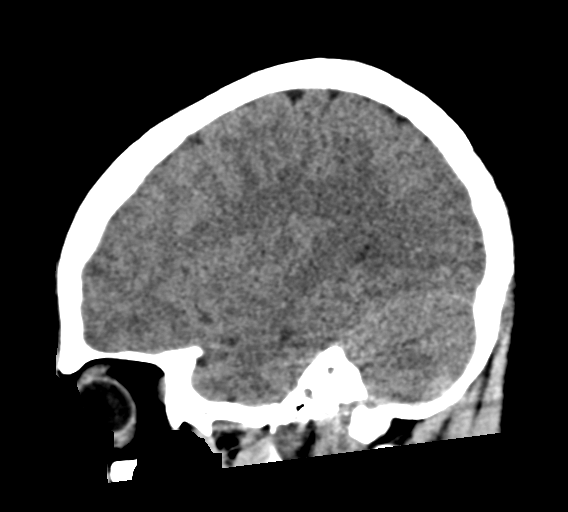
[im 27/54  brain]
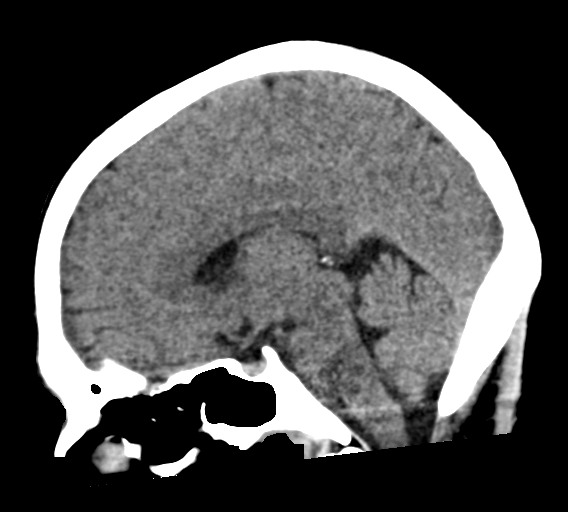
[im 36/54  brain]
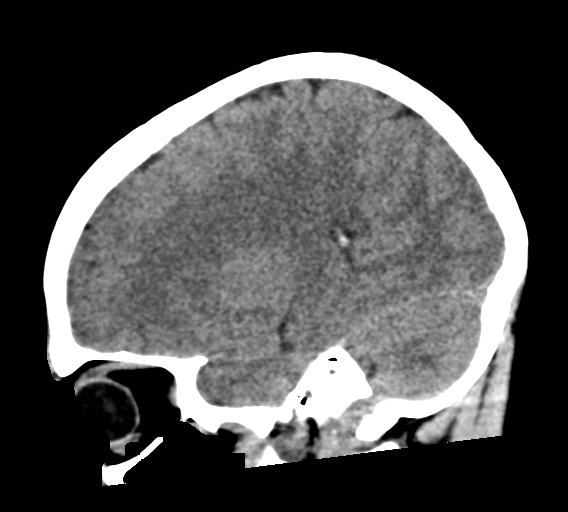

[16 of 47 positions shown; findings below may reference images not displayed]

FINDINGS: Brain: The ventricles are normal in size and configuration. There is
no intracranial mass, hemorrhage, extra-axial fluid collection, or
midline shift. Gray-white compartments appear normal. No evident
acute infarct.

Vascular: No hyperdense vessels.  No vascular calcification evident.

Skull: Bony calvarium appears intact.

Sinuses/Orbits: There is opacification in anterior left ethmoid air
cell. There is mild mucosal thickening in several other ethmoid air
cells. Other visualized paranasal sinuses are clear. Orbits appear
symmetric bilaterally.

Other: Mastoid air cells are clear.
IMPRESSION: Areas of ethmoid sinus disease.  Study otherwise unremarkable.

## 2020-04-07 ENCOUNTER — Other Ambulatory Visit: Payer: Self-pay

## 2020-04-07 ENCOUNTER — Encounter (HOSPITAL_BASED_OUTPATIENT_CLINIC_OR_DEPARTMENT_OTHER): Payer: Self-pay | Admitting: Emergency Medicine

## 2020-04-07 ENCOUNTER — Emergency Department (HOSPITAL_BASED_OUTPATIENT_CLINIC_OR_DEPARTMENT_OTHER)
Admission: EM | Admit: 2020-04-07 | Discharge: 2020-04-07 | Disposition: A | Discharge status: Home / Self Care | Payer: Self-pay | Attending: Emergency Medicine | Admitting: Emergency Medicine

## 2020-04-07 DIAGNOSIS — R350 Frequency of micturition: Secondary | ICD-10-CM

## 2020-04-07 DIAGNOSIS — N898 Other specified noninflammatory disorders of vagina: Secondary | ICD-10-CM | POA: Insufficient documentation

## 2020-04-07 DIAGNOSIS — Z202 Contact with and (suspected) exposure to infections with a predominantly sexual mode of transmission: Secondary | ICD-10-CM | POA: Insufficient documentation

## 2020-04-07 DIAGNOSIS — Z711 Person with feared health complaint in whom no diagnosis is made: Secondary | ICD-10-CM

## 2020-04-07 DIAGNOSIS — Z87891 Personal history of nicotine dependence: Secondary | ICD-10-CM | POA: Insufficient documentation

## 2020-04-07 LAB — WET PREP, GENITAL
Clue Cells Wet Prep HPF POC: NONE SEEN
Sperm: NONE SEEN
Trich, Wet Prep: NONE SEEN
Yeast Wet Prep HPF POC: NONE SEEN

## 2020-04-07 LAB — URINALYSIS, ROUTINE W REFLEX MICROSCOPIC
Bilirubin Urine: NEGATIVE
Glucose, UA: NEGATIVE mg/dL
Hgb urine dipstick: NEGATIVE
Ketones, ur: NEGATIVE mg/dL
Leukocytes,Ua: NEGATIVE
Nitrite: NEGATIVE
Protein, ur: NEGATIVE mg/dL
Specific Gravity, Urine: 1.025 (ref 1.005–1.030)
pH: 6 (ref 5.0–8.0)

## 2020-04-07 LAB — PREGNANCY, URINE: Preg Test, Ur: NEGATIVE

## 2020-04-07 MED ORDER — DOXYCYCLINE HYCLATE 100 MG PO CAPS: 100.0000 mg | ORAL_CAPSULE | Freq: Two times a day (BID) | ORAL | 0 refills | Status: AC

## 2020-04-07 MED ORDER — DOXYCYCLINE HYCLATE 100 MG PO TABS
100.0000 mg | ORAL_TABLET | Freq: Once | ORAL | Status: AC
  Administered 2020-04-07: 100 mg via ORAL
  Filled 2020-04-07: qty 1

## 2020-04-07 MED ORDER — CEFTRIAXONE SODIUM 500 MG IJ SOLR
500.0000 mg | Freq: Once | INTRAMUSCULAR | Status: DC
  Filled 2020-04-07: qty 500

## 2020-04-07 MED ORDER — LIDOCAINE HCL (PF) 1 % IJ SOLN
INTRAMUSCULAR | Status: AC
  Filled 2020-04-07: qty 5

## 2020-04-07 NOTE — ED Notes (Signed)
EDP at bedside  

## 2020-04-07 NOTE — ED Notes (Signed)
Pt refused Rocephin injection, despite education on STD treatment; lidocaine was used to reconstitute; EDP aware

## 2020-04-07 NOTE — Discharge Instructions (Addendum)
You were seen today for urinary frequency.  Your urinalysis does not show obvious infection.  Given concerns for some new vaginal discharge, you will be tested and treated for STDs.  You should abstain from sexual activity for the next 10 days and take medications as prescribed.  You will receive a phone call for any positive test results.

## 2020-04-07 NOTE — ED Provider Notes (Signed)
[facility] EMERGENCY DEPARTMENT Provider Note   CSN: [id number] Arrival date & time: 04/07/20  0106     History Chief Complaint  Patient presents with  . Urinary Frequency    Amber Dickerson is a 35 y.o. female.  HPI     This a 35 year old female with no reported past medical history who presents with urinary frequency and odor.  States that she has noted this over the last several weeks.  No fevers, flank pain, dysuria.  She does report some clear vaginal discharge.  Is unsure whether she may have an STD.  She denies any abdominal pain, nausea, vomiting.  History reviewed. No pertinent past medical history.  There are no problems to display for this patient.   Past Surgical History:  Procedure Laterality Date  . ELBOW SURGERY       OB History    Gravida  1   Para  1   Term  1   Preterm      AB      Living  1     SAB      IAB      Ectopic      Multiple      Live Births              No family history on file.  Social History   Tobacco Use  . Smoking status: Former Smoker    Packs/day: 0.00  . Smokeless tobacco: Never Used  Vaping Use  . Vaping Use: Never used  Substance Use Topics  . Alcohol use: Yes    Comment: occasional  . Drug use: Not Currently    Types: Marijuana    Home Medications Prior to Admission medications   Medication Sig Start Date End Date Taking? Authorizing Provider  doxycycline (VIBRAMYCIN) 100 MG capsule Take 1 capsule (100 mg total) by mouth 2 (two) times daily. 04/07/20  Yes Magenta Schmiesing, Mayer Masker, MD    Allergies    Patient has no known allergies.  Review of Systems   Review of Systems  Constitutional: Negative for fever.  Respiratory: Negative for shortness of breath.   Cardiovascular: Negative for chest pain.  Gastrointestinal: Negative for abdominal pain, nausea and vomiting.  Genitourinary: Positive for frequency and vaginal discharge. Negative for difficulty urinating and dysuria.  All other  systems reviewed and are negative.   Physical Exam Updated Vital Signs BP 138/83 (BP Location: Right Arm)   Pulse 98   Temp 98.1 F (36.7 C) (Oral)   Resp 19   Ht 1.651 m (5\' 5" )   Wt 71.7 kg   LMP 03/16/2020 (Approximate)   SpO2 98%   BMI 26.29 kg/m   Physical Exam Vitals and nursing note reviewed.  Constitutional:      Appearance: She is well-developed. She is not ill-appearing.     Comments: Smells of marijuana  HENT:     Head: Normocephalic and atraumatic.  Eyes:     Pupils: Pupils are equal, round, and reactive to light.  Cardiovascular:     Rate and Rhythm: Normal rate and regular rhythm.     Heart sounds: Normal heart sounds.  Pulmonary:     Effort: Pulmonary effort is normal. No respiratory distress.     Breath sounds: No wheezing.  Abdominal:     Palpations: Abdomen is soft.     Tenderness: There is no abdominal tenderness. There is no right CVA tenderness or left CVA tenderness.  Musculoskeletal:     Cervical back: Neck  supple.     Right lower leg: No edema.     Left lower leg: No edema.  Skin:    General: Skin is warm and dry.  Neurological:     Mental Status: She is alert and oriented to person, place, and time.  Psychiatric:        Mood and Affect: Mood normal.     ED Results / Procedures / Treatments   Labs (all labs ordered are listed, but only abnormal results are displayed) Labs Reviewed  WET PREP, GENITAL - Abnormal; Notable for the following components:      Result Value   WBC, Wet Prep HPF POC RARE (*)    All other components within normal limits  URINALYSIS, ROUTINE W REFLEX MICROSCOPIC  PREGNANCY, URINE  GC/CHLAMYDIA PROBE AMP ([place]) NOT AT [facility]    EKG None  Radiology No results found.  Procedures Procedures   Medications Ordered in ED Medications  cefTRIAXone (ROCEPHIN) injection 500 mg (has no administration in time range)  doxycycline (VIBRA-TABS) tablet 100 mg (has no administration in time range)    ED  Course  I have reviewed the triage vital signs and the nursing notes.  Pertinent labs & imaging results that were available during my care of the patient were reviewed by me and considered in my medical decision making (see chart for details).    MDM Rules/Calculators/A&P                          Patient presents with urinary frequency.  Overall nontoxic vital signs are reassuring.  Exam is benign.  No abdominal tenderness or CVA tenderness.  Considerations include UTI, STD.  Urinalysis obtained.  No evidence of obvious UTI.  Patient is not pregnant.  Patient elected to self swab for STDs.  She is not having any abdominal tenderness and feel this is reasonable.  Wet prep shows white cells but no other findings.  Given absence of UTI, will elect to treat for STDs with Rocephin and doxycycline.  We discussed safe sex practices.  She will abstain from sexual activity for the next 10 days.  After history, exam, and medical workup I feel the patient has been appropriately medically screened and is safe for discharge home. Pertinent diagnoses were discussed with the patient. Patient was given return precautions.  Final Clinical Impression(s) / ED Diagnoses Final diagnoses:  Urinary frequency  Concern about STD in female without diagnosis    Rx / DC Orders ED Discharge Orders         Ordered    doxycycline (VIBRAMYCIN) 100 MG capsule  2 times daily        04/07/20 0206           Shon Baton, MD 04/07/20 [phone]

## 2020-04-07 NOTE — ED Triage Notes (Signed)
Patient arrived via POV c/o urinary frequency with odor. Patient states strong smell from urine. Patient denies discharge or painful urination. Patient is AO x 4, VS WDL, normal gait.

## 2020-04-08 LAB — GC/CHLAMYDIA PROBE AMP (~~LOC~~) NOT AT ARMC
Chlamydia: NEGATIVE
Comment: NEGATIVE
Comment: NORMAL
Neisseria Gonorrhea: NEGATIVE

## 2020-12-23 ENCOUNTER — Other Ambulatory Visit: Payer: Self-pay

## 2020-12-23 ENCOUNTER — Emergency Department (HOSPITAL_BASED_OUTPATIENT_CLINIC_OR_DEPARTMENT_OTHER)
Admission: EM | Admit: 2020-12-23 | Discharge: 2020-12-23 | Disposition: A | Discharge status: Home / Self Care | Payer: Self-pay | Attending: Student | Admitting: Student

## 2020-12-23 DIAGNOSIS — N3090 Cystitis, unspecified without hematuria: Secondary | ICD-10-CM

## 2020-12-23 DIAGNOSIS — R300 Dysuria: Secondary | ICD-10-CM | POA: Insufficient documentation

## 2020-12-23 DIAGNOSIS — Z87891 Personal history of nicotine dependence: Secondary | ICD-10-CM | POA: Insufficient documentation

## 2020-12-23 LAB — URINALYSIS, ROUTINE W REFLEX MICROSCOPIC
Bilirubin Urine: NEGATIVE
Glucose, UA: NEGATIVE mg/dL
Ketones, ur: NEGATIVE mg/dL
Nitrite: NEGATIVE
Protein, ur: NEGATIVE mg/dL
Specific Gravity, Urine: 1.02 (ref 1.005–1.030)
pH: 7.5 (ref 5.0–8.0)

## 2020-12-23 LAB — URINALYSIS, MICROSCOPIC (REFLEX): WBC, UA: >50 WBC/hpf (ref 0–5)

## 2020-12-23 LAB — PREGNANCY, URINE: Preg Test, Ur: NEGATIVE

## 2020-12-23 MED ORDER — CEPHALEXIN 500 MG PO CAPS: 500.0000 mg | ORAL_CAPSULE | Freq: Four times a day (QID) | ORAL | 0 refills | Status: AC

## 2020-12-23 NOTE — ED Notes (Signed)
D/c paperwork reviewed with pt, including prescription. Pt with no questions or concerns at time of d/c. Ambulatory to ED exit.

## 2020-12-23 NOTE — ED Notes (Addendum)
Pt provided with juice and coffee, per her request. Pt aware urine sample is needed.

## 2020-12-23 NOTE — ED Provider Notes (Signed)
[facility] EMERGENCY DEPARTMENT Provider Note   CSN: [id number] Arrival date & time: 12/23/20  0759     History Chief Complaint  Patient presents with   Dysuria    Amber Dickerson is a 35 y.o. female who presents the emergency department for evaluation of dysuria.  Patient states she has had symptoms for 48 hours that have gradually worsened.  No associated nausea, vomiting, diarrhea, headache, cough, fever or other systemic symptoms.  No vaginal discharge or herpetic lesions.  Patient sexually active with women only but does use insertive sex toys and she is concerned this may be the source of her current dysuria.   Dysuria Associated symptoms: no abdominal pain, no fever and no vomiting       No past medical history on file.  There are no problems to display for this patient.   Past Surgical History:  Procedure Laterality Date   ELBOW SURGERY       OB History     Gravida  1   Para  1   Term  1   Preterm      AB      Living  1      SAB      IAB      Ectopic      Multiple      Live Births              No family history on file.  Social History   Tobacco Use   Smoking status: Former    Packs/day: 0.00    Types: Cigarettes   Smokeless tobacco: Never  Vaping Use   Vaping Use: Never used  Substance Use Topics   Alcohol use: Yes    Comment: occasional   Drug use: Not Currently    Types: Marijuana    Home Medications Prior to Admission medications   Medication Sig Start Date End Date Taking? Authorizing Provider  doxycycline (VIBRAMYCIN) 100 MG capsule Take 1 capsule (100 mg total) by mouth 2 (two) times daily. 04/07/20   Horton, Mayer Masker, MD    Allergies    Patient has no known allergies.  Review of Systems   Review of Systems  Constitutional:  Negative for chills and fever.  HENT:  Negative for ear pain and sore throat.   Eyes:  Negative for pain and visual disturbance.  Respiratory:  Negative for cough and shortness  of breath.   Cardiovascular:  Negative for chest pain and palpitations.  Gastrointestinal:  Negative for abdominal pain and vomiting.  Genitourinary:  Positive for dysuria. Negative for hematuria.  Musculoskeletal:  Negative for arthralgias and back pain.  Skin:  Negative for color change and rash.  Neurological:  Negative for seizures and syncope.  All other systems reviewed and are negative.  Physical Exam Updated Vital Signs BP (!) 149/94 (BP Location: Right Arm)   Pulse 84   Temp 98.2 F (36.8 C) (Oral)   Ht 5\' 5"  (1.651 m)   Wt 71.2 kg   LMP 11/29/2020 (Approximate)   SpO2 98%   BMI 26.13 kg/m   Physical Exam Vitals and nursing note reviewed.  Constitutional:      General: She is not in acute distress.    Appearance: She is well-developed.  HENT:     Head: Normocephalic and atraumatic.  Eyes:     Conjunctiva/sclera: Conjunctivae normal.  Cardiovascular:     Rate and Rhythm: Normal rate and regular rhythm.     Heart sounds: No murmur  heard. Pulmonary:     Effort: Pulmonary effort is normal. No respiratory distress.     Breath sounds: Normal breath sounds.  Abdominal:     Palpations: Abdomen is soft.     Tenderness: There is no abdominal tenderness.  Musculoskeletal:        General: No swelling.     Cervical back: Neck supple.  Skin:    General: Skin is warm and dry.     Capillary Refill: Capillary refill takes less than 2 seconds.  Neurological:     Mental Status: She is alert.  Psychiatric:        Mood and Affect: Mood normal.    ED Results / Procedures / Treatments   Labs (all labs ordered are listed, but only abnormal results are displayed) Labs Reviewed  URINALYSIS, Jermyn, URINE    EKG None  Radiology No results found.  Procedures Procedures   Medications Ordered in ED Medications - No data to display  ED Course  I have reviewed the triage vital signs and the nursing notes.  Pertinent labs & imaging  results that were available during my care of the patient were reviewed by me and considered in my medical decision making (see chart for details).    MDM Rules/Calculators/A&P                           Patient seen emergency department for evaluation of dysuria.  Physical exam reveals suprapubic tenderness but is otherwise unremarkable.  No CVA tenderness.  Urine pregnancy negative.  Urinalysis with greater than 50 white blood cells and many bacteria, concerning for cystitis.  Patient discharged on Keflex with return precautions which she voiced understanding.  Patient discharged. Final Clinical Impression(s) / ED Diagnoses Final diagnoses:  None    Rx / DC Orders ED Discharge Orders     None        Shandy Checo, Debe Coder, MD 12/23/20 [phone]

## 2020-12-23 NOTE — ED Triage Notes (Signed)
Pt POV reports frequent painful urination x2 days. Denies discharge, denies fevers.
